# Patient Record
Sex: Female | Born: 1975 | Race: White | Hispanic: No | Marital: Married | Smoking: Never smoker
Health system: Southern US, Community
[De-identification: ages and names within clinical notes are randomized; demographics above are authoritative.]

## PROBLEM LIST (undated history)

## (undated) DIAGNOSIS — E119 Type 2 diabetes mellitus without complications: Secondary | ICD-10-CM

## (undated) DIAGNOSIS — R011 Cardiac murmur, unspecified: Secondary | ICD-10-CM

## (undated) DIAGNOSIS — E785 Hyperlipidemia, unspecified: Secondary | ICD-10-CM

## (undated) HISTORY — DX: Cardiac murmur, unspecified: R01.1

## (undated) HISTORY — DX: Hyperlipidemia, unspecified: E78.5

## (undated) HISTORY — DX: Type 2 diabetes mellitus without complications: E11.9

## (undated) HISTORY — PX: WISDOM TOOTH EXTRACTION: SHX21

---

## 2014-03-25 LAB — HM DIABETES EYE EXAM

## 2014-08-14 ENCOUNTER — Encounter: Payer: Self-pay | Admitting: Internal Medicine

## 2014-08-14 DIAGNOSIS — E611 Iron deficiency: Secondary | ICD-10-CM

## 2014-08-14 DIAGNOSIS — E119 Type 2 diabetes mellitus without complications: Secondary | ICD-10-CM

## 2014-08-14 DIAGNOSIS — E118 Type 2 diabetes mellitus with unspecified complications: Secondary | ICD-10-CM | POA: Insufficient documentation

## 2014-08-14 DIAGNOSIS — I10 Essential (primary) hypertension: Secondary | ICD-10-CM | POA: Insufficient documentation

## 2014-08-23 ENCOUNTER — Other Ambulatory Visit: Payer: Self-pay | Admitting: Obstetrics and Gynecology

## 2014-08-23 DIAGNOSIS — Z1231 Encounter for screening mammogram for malignant neoplasm of breast: Secondary | ICD-10-CM

## 2014-08-28 ENCOUNTER — Ambulatory Visit
Admission: RE | Admit: 2014-08-28 | Discharge: 2014-08-28 | Disposition: A | Discharge status: Home / Self Care | Payer: PRIVATE HEALTH INSURANCE | Source: Ambulatory Visit | Attending: Obstetrics and Gynecology | Admitting: Obstetrics and Gynecology

## 2014-08-28 ENCOUNTER — Other Ambulatory Visit: Payer: Self-pay | Admitting: Internal Medicine

## 2014-08-28 DIAGNOSIS — Z1231 Encounter for screening mammogram for malignant neoplasm of breast: Secondary | ICD-10-CM | POA: Insufficient documentation

## 2014-10-02 ENCOUNTER — Ambulatory Visit: Payer: Self-pay | Admitting: Internal Medicine

## 2014-10-10 ENCOUNTER — Encounter: Payer: Self-pay | Admitting: Internal Medicine

## 2014-10-11 ENCOUNTER — Ambulatory Visit (INDEPENDENT_AMBULATORY_CARE_PROVIDER_SITE_OTHER): Payer: PRIVATE HEALTH INSURANCE | Admitting: Family Medicine

## 2014-10-11 ENCOUNTER — Other Ambulatory Visit: Payer: Self-pay

## 2014-10-11 ENCOUNTER — Encounter: Payer: Self-pay | Admitting: Family Medicine

## 2014-10-11 VITALS — BP 120/80 | HR 64 | Ht 68.0 in | Wt 209.0 lb

## 2014-10-11 DIAGNOSIS — E1169 Type 2 diabetes mellitus with other specified complication: Secondary | ICD-10-CM | POA: Insufficient documentation

## 2014-10-11 DIAGNOSIS — IMO0002 Reserved for concepts with insufficient information to code with codable children: Secondary | ICD-10-CM

## 2014-10-11 DIAGNOSIS — I10 Essential (primary) hypertension: Secondary | ICD-10-CM | POA: Diagnosis not present

## 2014-10-11 DIAGNOSIS — E785 Hyperlipidemia, unspecified: Secondary | ICD-10-CM | POA: Diagnosis not present

## 2014-10-11 DIAGNOSIS — E1165 Type 2 diabetes mellitus with hyperglycemia: Secondary | ICD-10-CM | POA: Diagnosis not present

## 2014-10-11 MED ORDER — METFORMIN HCL ER 500 MG PO TB24: 500.0000 mg | ORAL_TABLET | Freq: Every day | ORAL | Status: DC

## 2014-10-11 MED ORDER — ATORVASTATIN CALCIUM 10 MG PO TABS: 10.0000 mg | ORAL_TABLET | Freq: Every day | ORAL | Status: DC

## 2014-10-11 MED ORDER — LISINOPRIL 20 MG PO TABS: 20.0000 mg | ORAL_TABLET | Freq: Every day | ORAL | Status: DC

## 2014-10-11 NOTE — Progress Notes (Signed)
Name: Alicia Richard   MRN: [id number]    DOB: 14-May-1975   Date:10/11/2014       Progress Note  Subjective  Chief Complaint  Chief Complaint  Patient presents with  . Diabetes    changed to ER- 2 tabs in am approx 4 months ago- no BS taken at home    Diabetes She presents for her follow-up diabetic visit. She has type 2 diabetes mellitus. Her disease course has been fluctuating. There are no hypoglycemic associated symptoms. Pertinent negatives for hypoglycemia include no dizziness, headaches or nervousness/anxiousness. Pertinent negatives for diabetes include no blurred vision, no chest pain, no fatigue, no foot paresthesias, no foot ulcerations, no polydipsia, no polyphagia, no polyuria, no visual change, no weakness and no weight loss. Symptoms are stable. Pertinent negatives for diabetic complications include no autonomic neuropathy, CVA, heart disease, nephropathy, peripheral neuropathy, PVD or retinopathy. Risk factors for coronary artery disease include diabetes mellitus, dyslipidemia and hypertension. Current diabetic treatment includes oral agent (monotherapy). She is compliant with treatment most of the time. Her weight is stable. She is following a generally healthy diet. She participates in exercise intermittently. An ACE inhibitor/angiotensin II receptor blocker is being taken. She does not see a podiatrist.Eye exam is current.  Hypertension This is a recurrent problem. The current episode started more than 1 year ago. The problem has been gradually improving since onset. Pertinent negatives include no anxiety, blurred vision, chest pain, headaches, malaise/fatigue, neck pain, palpitations, PND or shortness of breath. Past treatments include ACE inhibitors. The current treatment provides mild improvement. There are no compliance problems.  There is no history of angina, kidney disease, CAD/MI, CVA, heart failure, left ventricular hypertrophy, PVD, renovascular disease or retinopathy. There  is no history of chronic renal disease.  Hyperlipidemia This is a recurrent problem. The current episode started more than 1 year ago. The problem is controlled. Recent lipid tests were reviewed and are normal. She has no history of chronic renal disease, diabetes, hypothyroidism, liver disease, obesity or nephrotic syndrome. Pertinent negatives include no chest pain, focal sensory loss, focal weakness, leg pain, myalgias or shortness of breath. Current antihyperlipidemic treatment includes statins. The current treatment provides mild improvement of lipids. There are no compliance problems.     No problem-specific assessment & plan notes found for this encounter.   Past Medical History  Diagnosis Date  . Hyperlipidemia   . Diabetes mellitus without complication     History reviewed. No pertinent past surgical history.  Family History  Problem Relation Age of Onset  . Hypertension Mother   . Diabetes Maternal Grandmother   . Diabetes Paternal Grandmother     Social History   Social History  . Marital Status: Married    Spouse Name: N/A  . Number of Children: N/A  . Years of Education: N/A   Occupational History  . Not on file.   Social History Main Topics  . Smoking status: Never Smoker   . Smokeless tobacco: Not on file  . Alcohol Use: No  . Drug Use: No  . Sexual Activity: Yes   Other Topics Concern  . Not on file   Social History Narrative    No Known Allergies   Review of Systems  Constitutional: Negative for fever, chills, weight loss, malaise/fatigue and fatigue.  HENT: Negative for ear discharge, ear pain and sore throat.   Eyes: Negative for blurred vision.  Respiratory: Negative for cough, sputum production, shortness of breath and wheezing.   Cardiovascular: Negative for chest  pain, palpitations, leg swelling and PND.  Gastrointestinal: Negative for heartburn, nausea, abdominal pain, diarrhea, constipation, blood in stool and melena.  Genitourinary:  Negative for dysuria, urgency, frequency and hematuria.  Musculoskeletal: Negative for myalgias, back pain, joint pain and neck pain.  Skin: Negative for rash.  Neurological: Negative for dizziness, tingling, sensory change, focal weakness, weakness and headaches.  Endo/Heme/Allergies: Negative for environmental allergies, polydipsia and polyphagia. Does not bruise/bleed easily.  Psychiatric/Behavioral: Negative for depression and suicidal ideas. The patient is not nervous/anxious and does not have insomnia.      Objective  Filed Vitals:   10/11/14 0807  BP: 120/80  Pulse: 64  Height: 5\' 8"  (1.727 m)  Weight: 209 lb (94.802 kg)    Physical Exam  Constitutional: She is well-developed, well-nourished, and in no distress. No distress.  HENT:  Head: Normocephalic and atraumatic.  Right Ear: External ear normal.  Left Ear: External ear normal.  Nose: Nose normal.  Mouth/Throat: Oropharynx is clear and moist.  Eyes: Conjunctivae and EOM are normal. Pupils are equal, round, and reactive to light. Right eye exhibits no discharge. Left eye exhibits no discharge.  Neck: Normal range of motion. Neck supple. No JVD present. No thyromegaly present.  Cardiovascular: Normal rate, regular rhythm, normal heart sounds and intact distal pulses.  Exam reveals no gallop and no friction rub.   No murmur heard. Pulmonary/Chest: Effort normal and breath sounds normal.  Abdominal: Soft. Bowel sounds are normal. She exhibits no mass. There is no tenderness. There is no guarding.  Musculoskeletal: Normal range of motion. She exhibits no edema.  Lymphadenopathy:    She has no cervical adenopathy.  Neurological: She is alert. She has normal reflexes.  Skin: Skin is warm and dry. She is not diaphoretic.  Psychiatric: Mood and affect normal.      Assessment & Plan  Problem List Items Addressed This Visit      Cardiovascular and Mediastinum   Essential (primary) hypertension   Relevant Medications    atorvastatin (LIPITOR) 10 MG tablet   lisinopril (PRINIVIL,ZESTRIL) 20 MG tablet   Other Relevant Orders   Renal Function Panel     Other   Diabetes mellitus type 2, uncontrolled - Primary   Relevant Medications   atorvastatin (LIPITOR) 10 MG tablet   lisinopril (PRINIVIL,ZESTRIL) 20 MG tablet   metFORMIN (GLUCOPHAGE-XR) 500 MG 24 hr tablet   Other Relevant Orders   HgB A1c   Hyperlipidemia   Relevant Medications   atorvastatin (LIPITOR) 10 MG tablet   lisinopril (PRINIVIL,ZESTRIL) 20 MG tablet   Other Relevant Orders   Lipid Profile        Dr. Otilio Miu [facility]  10/11/2014

## 2014-10-11 NOTE — Addendum Note (Signed)
Addended by: Juline Patch on: 10/11/2014 08:43 AM   Modules accepted: Orders

## 2014-10-12 LAB — LIPID PANEL
CHOL/HDL RATIO: 3.6 ratio (ref 0.0–4.4)
Cholesterol, Total: 127 mg/dL (ref 100–199)
HDL: 35 mg/dL — AB (ref >39)
LDL Calculated: 53 mg/dL (ref 0–99)
TRIGLYCERIDES: 193 mg/dL — AB (ref 0–149)
VLDL CHOLESTEROL CAL: 39 mg/dL (ref 5–40)

## 2014-10-12 LAB — HEMOGLOBIN A1C
Est. average glucose Bld gHb Est-mCnc: 171 mg/dL
HEMOGLOBIN A1C: 7.6 % — AB (ref 4.8–5.6)

## 2014-10-12 LAB — RENAL FUNCTION PANEL: Phosphorus: 3.6 mg/dL (ref 2.5–4.5)

## 2014-10-18 ENCOUNTER — Other Ambulatory Visit: Payer: Self-pay

## 2014-10-18 DIAGNOSIS — E1165 Type 2 diabetes mellitus with hyperglycemia: Secondary | ICD-10-CM

## 2014-10-18 DIAGNOSIS — IMO0002 Reserved for concepts with insufficient information to code with codable children: Secondary | ICD-10-CM

## 2014-10-18 MED ORDER — SITAGLIPTIN PHOSPHATE 100 MG PO TABS: 100.0000 mg | ORAL_TABLET | Freq: Every day | ORAL | Status: DC

## 2014-11-27 ENCOUNTER — Ambulatory Visit (INDEPENDENT_AMBULATORY_CARE_PROVIDER_SITE_OTHER): Payer: PRIVATE HEALTH INSURANCE | Admitting: Internal Medicine

## 2014-11-27 ENCOUNTER — Other Ambulatory Visit: Payer: Self-pay | Admitting: Internal Medicine

## 2014-11-27 ENCOUNTER — Encounter: Payer: Self-pay | Admitting: Internal Medicine

## 2014-11-27 VITALS — BP 130/68 | HR 80 | Ht 68.0 in | Wt 203.0 lb

## 2014-11-27 DIAGNOSIS — E1165 Type 2 diabetes mellitus with hyperglycemia: Secondary | ICD-10-CM | POA: Diagnosis not present

## 2014-11-27 DIAGNOSIS — Z23 Encounter for immunization: Secondary | ICD-10-CM

## 2014-11-27 DIAGNOSIS — IMO0001 Reserved for inherently not codable concepts without codable children: Secondary | ICD-10-CM

## 2014-11-27 NOTE — Progress Notes (Signed)
Date:  11/27/2014   Name:  Alicia Richard   DOB:  [birth year]   MRN:  [id number]   Chief Complaint: Diabetes  Patient is being seen to follow-up on diabetes. She was seen a couple months ago by Dr. Ronnald Ramp who checked her A1c and started her on Januvia. Her A1c was 7.6. Januvia was dosed at 100 mg daily. She's been monitoring her blood sugar several times per week and is running between 90 and 144. She feels well without any side effects to medication. Her only complaint is cost of $40 per month.    Review of Systems  Constitutional: Positive for unexpected weight change (She has lost about 13 pounds over the past few months with diet and exercise). Negative for fever, chills and fatigue.  Eyes: Negative for visual disturbance.  Respiratory: Negative for chest tightness and shortness of breath.   Cardiovascular: Negative for chest pain, palpitations and leg swelling.  Gastrointestinal: Negative for nausea and diarrhea.  Endocrine: Negative for polydipsia and polyuria.  Genitourinary: Negative for frequency.    Patient Active Problem List   Diagnosis Date Noted  . Hyperlipidemia 10/11/2014  . Essential (primary) hypertension 08/14/2014  . Iron deficiency 08/14/2014  . Diabetes mellitus type 2, uncontrolled ([place]) 08/14/2014    Prior to Admission medications   Medication Sig Start Date End Date Taking? Authorizing Provider  atorvastatin (LIPITOR) 10 MG tablet Take 1 tablet (10 mg total) by mouth at bedtime. 10/11/14  Yes Juline Patch, MD  FREESTYLE LITE test strip U UTD ONCE D 10/21/14  Yes Historical Provider, MD  lisinopril (PRINIVIL,ZESTRIL) 20 MG tablet Take 1 tablet (20 mg total) by mouth daily. 10/11/14  Yes Juline Patch, MD  metFORMIN (GLUCOPHAGE-XR) 500 MG 24 hr tablet Take 1 tablet (500 mg total) by mouth daily with breakfast. Take 2 tablets (1000 mg total) by mouth with breakfast 10/11/14  Yes Juline Patch, MD  sitaGLIPtin (JANUVIA) 100 MG tablet Take 1 tablet (100 mg total) by  mouth daily. 10/18/14  Yes Juline Patch, MD    No Known Allergies  No past surgical history on file.  Social History  Substance Use Topics  . Smoking status: Never Smoker   . Smokeless tobacco: None  . Alcohol Use: No     Medication list has been reviewed and updated.  Physical Examination:  Physical Exam  Constitutional: She appears well-developed.  Neck: Normal range of motion. Carotid bruit is not present.  Cardiovascular: Normal rate, regular rhythm and normal heart sounds.   Pulmonary/Chest: Effort normal and breath sounds normal.  Musculoskeletal: She exhibits no edema.  Skin: Skin is warm, dry and intact.  Psychiatric: She has a normal mood and affect.    BP 130/68 mmHg  Pulse 80  Ht 5\' 8"  (1.727 m)  Wt 203 lb (92.08 kg)  BMI 30.87 kg/m2  LMP 11/03/2014 (Approximate)  Assessment and Plan: 1. Uncontrolled type 2 diabetes mellitus without complication, without long-term current use of insulin (HCC) Continue metformin and regular FSBS Coupon for 24 months of free Onglyza is given Recommend that she check prices for several medications in the same class call tomorrow for her preferred  2. Need for influenza vaccination - Flu Vaccine QUAD 36+ mos IM   [name], MD [facility]  11/27/2014

## 2014-11-28 ENCOUNTER — Other Ambulatory Visit: Payer: Self-pay | Admitting: Internal Medicine

## 2014-11-28 ENCOUNTER — Telehealth: Payer: Self-pay

## 2014-11-28 DIAGNOSIS — IMO0001 Reserved for inherently not codable concepts without codable children: Secondary | ICD-10-CM

## 2014-11-28 DIAGNOSIS — E1165 Type 2 diabetes mellitus with hyperglycemia: Principal | ICD-10-CM

## 2014-11-28 MED ORDER — SITAGLIPTIN PHOSPHATE 100 MG PO TABS: 100.0000 mg | ORAL_TABLET | Freq: Every day | ORAL | Status: DC

## 2014-11-28 NOTE — Telephone Encounter (Signed)
Patient would like to stay with Januvia and it will be better cost if you send for 90 days. dr

## 2015-03-08 ENCOUNTER — Other Ambulatory Visit: Payer: Self-pay | Admitting: Internal Medicine

## 2015-04-14 ENCOUNTER — Encounter: Payer: Self-pay | Admitting: Internal Medicine

## 2015-04-14 ENCOUNTER — Ambulatory Visit (INDEPENDENT_AMBULATORY_CARE_PROVIDER_SITE_OTHER): Payer: PRIVATE HEALTH INSURANCE | Admitting: Internal Medicine

## 2015-04-14 VITALS — BP 124/76 | HR 72 | Ht 68.0 in | Wt 204.0 lb

## 2015-04-14 DIAGNOSIS — I10 Essential (primary) hypertension: Secondary | ICD-10-CM | POA: Diagnosis not present

## 2015-04-14 DIAGNOSIS — E1165 Type 2 diabetes mellitus with hyperglycemia: Secondary | ICD-10-CM | POA: Diagnosis not present

## 2015-04-14 DIAGNOSIS — IMO0001 Reserved for inherently not codable concepts without codable children: Secondary | ICD-10-CM

## 2015-04-14 DIAGNOSIS — E785 Hyperlipidemia, unspecified: Secondary | ICD-10-CM | POA: Diagnosis not present

## 2015-04-14 MED ORDER — LISINOPRIL 20 MG PO TABS: 20.0000 mg | ORAL_TABLET | Freq: Every day | ORAL | Status: DC

## 2015-04-14 NOTE — Progress Notes (Signed)
Date:  04/14/2015   Name:  Alicia Richard   DOB:  Jan 27, 1976   MRN:  [id number]   Chief Complaint: Follow-up; Diabetes; Hyperlipidemia; and Hypertension Diabetes She presents for her follow-up diabetic visit. She has type 2 diabetes mellitus. Her disease course has been stable. Pertinent negatives for hypoglycemia include no headaches or tremors. Pertinent negatives for diabetes include no chest pain, no fatigue, no polydipsia and no polyuria. Current diabetic treatment includes oral agent (dual therapy). She is compliant with treatment most of the time. She monitors blood glucose at home 1-2 x per day. Her breakfast blood glucose is taken between 7-8 am. Her breakfast blood glucose range is generally 110-130 mg/dl. An ACE inhibitor/angiotensin II receptor blocker is being taken.  Hyperlipidemia The current episode started more than 1 year ago. Pertinent negatives include no chest pain or shortness of breath. Current antihyperlipidemic treatment includes statins.  Hypertension This is a chronic problem. The current episode started more than 1 year ago. The problem is unchanged. The problem is controlled. Pertinent negatives include no chest pain, headaches, palpitations or shortness of breath. Past treatments include ACE inhibitors.    Lab Results  Component Value Date   HGBA1C 7.6* 10/11/2014   Lab Results  Component Value Date   CHOL 127 10/11/2014   HDL 35* 10/11/2014   LDLCALC 53 10/11/2014   TRIG 193* 10/11/2014   CHOLHDL 3.6 10/11/2014     Review of Systems  Constitutional: Negative for fever, appetite change, fatigue and unexpected weight change (she has lost about 12 lbs with life style changes).  HENT: Negative for tinnitus and trouble swallowing.   Eyes: Negative for visual disturbance.  Respiratory: Negative for cough, chest tightness and shortness of breath.   Cardiovascular: Negative for chest pain, palpitations and leg swelling.  Gastrointestinal: Negative for  abdominal pain.  Endocrine: Negative for polydipsia and polyuria.  Genitourinary: Negative for dysuria and hematuria.  Musculoskeletal: Negative for arthralgias.  Neurological: Negative for tremors, numbness and headaches.  Psychiatric/Behavioral: Negative for dysphoric mood.    Patient Active Problem List   Diagnosis Date Noted  . Hyperlipidemia 10/11/2014  . Essential (primary) hypertension 08/14/2014  . Iron deficiency 08/14/2014  . Diabetes mellitus type 2, uncontrolled ([place]) 08/14/2014    Prior to Admission medications   Medication Sig Start Date End Date Taking? Authorizing Provider  atorvastatin (LIPITOR) 10 MG tablet Take 1 tablet (10 mg total) by mouth at bedtime. 10/11/14  Yes Alicia Patch, MD  FREESTYLE LITE test strip U UTD ONCE D 10/21/14  Yes Historical Provider, MD  lisinopril (PRINIVIL,ZESTRIL) 20 MG tablet Take 1 tablet (20 mg total) by mouth daily. 10/11/14  Yes Alicia Patch, MD  metFORMIN (GLUCOPHAGE-XR) 500 MG 24 hr tablet TAKE 2 TABLETS BY MOUTH DAILY 03/08/15  Yes Alicia Hess, MD  sitaGLIPtin (JANUVIA) 100 MG tablet Take 1 tablet (100 mg total) by mouth daily. 11/28/14  Yes Alicia Hess, MD    No Known Allergies  Past Surgical History  Procedure Laterality Date  . Wisdom tooth extraction      Social History  Substance Use Topics  . Smoking status: Never Smoker   . Smokeless tobacco: None  . Alcohol Use: No     Medication list has been reviewed and updated.   Physical Exam  Constitutional: She is oriented to person, place, and time. She appears well-developed and well-nourished.  Eyes: Pupils are equal, round, and reactive to light.  Neck: Normal range of motion. Neck supple.  No thyromegaly present.  Cardiovascular: Normal rate, regular rhythm and normal heart sounds.   Pulmonary/Chest: Effort normal and breath sounds normal. She has no wheezes.  Musculoskeletal: She exhibits no edema or tenderness.  Neurological: She is alert and  oriented to person, place, and time. She has normal reflexes.  Skin: Skin is warm and dry.  Nursing note and vitals reviewed.   BP 124/76 mmHg  Pulse 72  Ht 5\' 8"  (1.727 m)  Wt 204 lb (92.534 kg)  BMI 31.03 kg/m2  LMP 04/08/2015  Assessment and Plan: 1. Uncontrolled type 2 diabetes mellitus without complication, without long-term current use of insulin ([place]) Doing well - continue medication and lifestyle changes for ongoing weight loss - Hemoglobin [id number] - Basic metabolic panel  2. Essential (primary) hypertension controlled - lisinopril (PRINIVIL,ZESTRIL) 20 MG tablet; Take 1 tablet (20 mg total) by mouth daily.  Dispense: 30 tablet; Refill: 12  3. Hyperlipidemia On statin therapy   Alicia Maidens, MD [facility]  04/14/2015

## 2015-04-15 LAB — BASIC METABOLIC PANEL
BUN/Creatinine Ratio: 14 (ref 9–23)
BUN: 8 mg/dL (ref 6–24)
CALCIUM: 9.2 mg/dL (ref 8.7–10.2)
CHLORIDE: 100 mmol/L (ref 96–106)
CO2: 25 mmol/L (ref 18–29)
CREATININE: 0.58 mg/dL (ref 0.57–1.00)
GFR calc Af Amer: 133 mL/min/{1.73_m2} (ref >59)
GFR calc non Af Amer: 116 mL/min/{1.73_m2} (ref >59)
GLUCOSE: 127 mg/dL — AB (ref 65–99)
Potassium: 4.2 mmol/L (ref 3.5–5.2)
Sodium: 139 mmol/L (ref 134–144)

## 2015-04-15 LAB — HEMOGLOBIN A1C
ESTIMATED AVERAGE GLUCOSE: 151 mg/dL
Hgb A1c MFr Bld: 6.9 % — ABNORMAL HIGH (ref 4.8–5.6)

## 2015-04-16 ENCOUNTER — Telehealth: Payer: Self-pay

## 2015-04-16 NOTE — Telephone Encounter (Signed)
-----   Message from Glean Hess, MD sent at 04/15/2015  1:23 PM EST ----- DM is much better - down to 6.9!  Continue same medication and diet/weight loss.

## 2015-04-16 NOTE — Telephone Encounter (Signed)
Left message for patient to call back  

## 2015-04-17 NOTE — Telephone Encounter (Signed)
Left message for patient to call back  

## 2015-04-21 NOTE — Telephone Encounter (Signed)
Spoke with patient. Patient advised of all results and verbalized understanding. Will call back with any future questions or concerns. MAH  

## 2015-06-02 ENCOUNTER — Other Ambulatory Visit: Payer: Self-pay | Admitting: Internal Medicine

## 2015-08-11 ENCOUNTER — Ambulatory Visit (INDEPENDENT_AMBULATORY_CARE_PROVIDER_SITE_OTHER): Payer: PRIVATE HEALTH INSURANCE | Admitting: Internal Medicine

## 2015-08-11 ENCOUNTER — Encounter: Payer: Self-pay | Admitting: Internal Medicine

## 2015-08-11 VITALS — BP 128/80 | HR 64 | Ht 68.0 in | Wt 200.0 lb

## 2015-08-11 DIAGNOSIS — E785 Hyperlipidemia, unspecified: Secondary | ICD-10-CM

## 2015-08-11 DIAGNOSIS — I10 Essential (primary) hypertension: Secondary | ICD-10-CM

## 2015-08-11 DIAGNOSIS — E1165 Type 2 diabetes mellitus with hyperglycemia: Secondary | ICD-10-CM | POA: Diagnosis not present

## 2015-08-11 DIAGNOSIS — IMO0001 Reserved for inherently not codable concepts without codable children: Secondary | ICD-10-CM

## 2015-08-11 MED ORDER — ATORVASTATIN CALCIUM 10 MG PO TABS: 10.0000 mg | ORAL_TABLET | Freq: Every day | ORAL | Status: DC

## 2015-08-11 NOTE — Progress Notes (Signed)
Date:  08/11/2015   Name:  Alicia Richard   DOB:  03-08-1975   MRN:  [id number]   Chief Complaint: Diabetes Diabetes Pertinent negatives for hypoglycemia include no dizziness, headaches, nervousness/anxiousness or tremors. Pertinent negatives for diabetes include no chest pain, no fatigue, no polydipsia and no polyuria.  She has been checking BS about once every 2 weeks.  Meter needs a new battery so hasn't done it recently.  She continues on healthy diet, is walking daily and has lost 4 more pounds.  Lab Results  Component Value Date   HGBA1C 6.9* 04/14/2015     Review of Systems  Constitutional: Negative for fever, chills, appetite change, fatigue and unexpected weight change.  HENT: Negative for congestion, hearing loss, tinnitus, trouble swallowing and voice change.   Eyes: Negative for visual disturbance.  Respiratory: Negative for cough, chest tightness, shortness of breath and wheezing.   Cardiovascular: Negative for chest pain, palpitations and leg swelling.  Gastrointestinal: Negative for vomiting, abdominal pain, diarrhea and constipation.  Endocrine: Negative for polydipsia and polyuria.  Genitourinary: Negative for dysuria, frequency, hematuria, vaginal bleeding, vaginal discharge and genital sores.  Musculoskeletal: Negative for joint swelling, arthralgias and gait problem.  Skin: Negative for color change and rash.  Neurological: Negative for dizziness, tremors, light-headedness, numbness and headaches.  Hematological: Negative for adenopathy. Does not bruise/bleed easily.  Psychiatric/Behavioral: Negative for sleep disturbance and dysphoric mood. The patient is not nervous/anxious.     Patient Active Problem List   Diagnosis Date Noted  . Hyperlipidemia 10/11/2014  . Essential (primary) hypertension 08/14/2014  . Iron deficiency 08/14/2014  . Diabetes mellitus type 2, uncontrolled ([place]) 08/14/2014    Prior to Admission medications   Medication Sig Start Date  End Date Taking? Authorizing Provider  atorvastatin (LIPITOR) 10 MG tablet Take 1 tablet (10 mg total) by mouth at bedtime. 10/11/14  Yes Juline Patch, MD  FREESTYLE LITE test strip U UTD ONCE D 10/21/14  Yes Historical Provider, MD  lisinopril (PRINIVIL,ZESTRIL) 20 MG tablet Take 1 tablet (20 mg total) by mouth daily. 04/14/15  Yes Glean Hess, MD  metFORMIN (GLUCOPHAGE-XR) 500 MG 24 hr tablet TAKE 2 TABLETS BY MOUTH DAILY 06/03/15  Yes Glean Hess, MD  sitaGLIPtin (JANUVIA) 100 MG tablet Take 1 tablet (100 mg total) by mouth daily. 11/28/14  Yes Glean Hess, MD    No Known Allergies  Past Surgical History  Procedure Laterality Date  . Wisdom tooth extraction      Social History  Substance Use Topics  . Smoking status: Never Smoker   . Smokeless tobacco: None  . Alcohol Use: No     Medication list has been reviewed and updated.   Physical Exam  Constitutional: She is oriented to person, place, and time. She appears well-developed. No distress.  HENT:  Head: Normocephalic and atraumatic.  Neck: Normal range of motion. Neck supple. No thyromegaly present.  Cardiovascular: Normal rate, regular rhythm and normal heart sounds.   Pulmonary/Chest: Effort normal and breath sounds normal. No respiratory distress. She has no wheezes.  Musculoskeletal: Normal range of motion.  Lymphadenopathy:    She has no cervical adenopathy.  Neurological: She is alert and oriented to person, place, and time.  Skin: Skin is warm and dry. No rash noted.  Psychiatric: She has a normal mood and affect. Her behavior is normal. Thought content normal.    BP 128/80 mmHg  Pulse 64  Ht 5\' 8"  (1.727 m)  Wt 200 lb (  90.719 kg)  BMI 30.42 kg/m2  LMP 07/06/2015 (Approximate)  Assessment and Plan: 1. Uncontrolled type 2 diabetes mellitus without complication, without long-term current use of insulin (HCC) Continue current therapy, diet and exercise - Hemoglobin A1c  2. Essential (primary)  hypertension controlled  3. Hyperlipidemia On statin therapy - atorvastatin (LIPITOR) 10 MG tablet; Take 1 tablet (10 mg total) by mouth at bedtime.  Dispense: 30 tablet; Refill: [place], MD [facility]  08/11/2015

## 2015-08-12 ENCOUNTER — Ambulatory Visit: Payer: PRIVATE HEALTH INSURANCE | Admitting: Internal Medicine

## 2015-08-12 LAB — HEMOGLOBIN A1C
ESTIMATED AVERAGE GLUCOSE: 137 mg/dL
Hgb A1c MFr Bld: 6.4 % — ABNORMAL HIGH (ref 4.8–5.6)

## 2015-09-10 ENCOUNTER — Other Ambulatory Visit: Payer: Self-pay | Admitting: Obstetrics and Gynecology

## 2015-09-10 DIAGNOSIS — Z1231 Encounter for screening mammogram for malignant neoplasm of breast: Secondary | ICD-10-CM

## 2015-09-24 ENCOUNTER — Ambulatory Visit
Admission: RE | Admit: 2015-09-24 | Discharge: 2015-09-24 | Disposition: A | Discharge status: Home / Self Care | Payer: PRIVATE HEALTH INSURANCE | Source: Ambulatory Visit | Attending: Obstetrics and Gynecology | Admitting: Obstetrics and Gynecology

## 2015-09-24 DIAGNOSIS — Z1231 Encounter for screening mammogram for malignant neoplasm of breast: Secondary | ICD-10-CM | POA: Diagnosis present

## 2015-09-25 ENCOUNTER — Other Ambulatory Visit: Payer: Self-pay | Admitting: Obstetrics and Gynecology

## 2015-09-25 DIAGNOSIS — R921 Mammographic calcification found on diagnostic imaging of breast: Secondary | ICD-10-CM

## 2015-09-29 ENCOUNTER — Ambulatory Visit
Admission: RE | Admit: 2015-09-29 | Discharge: 2015-09-29 | Disposition: A | Discharge status: Home / Self Care | Payer: PRIVATE HEALTH INSURANCE | Source: Ambulatory Visit | Attending: Obstetrics and Gynecology | Admitting: Obstetrics and Gynecology

## 2015-09-29 DIAGNOSIS — R921 Mammographic calcification found on diagnostic imaging of breast: Secondary | ICD-10-CM

## 2015-09-30 ENCOUNTER — Other Ambulatory Visit: Payer: Self-pay | Admitting: Obstetrics and Gynecology

## 2015-09-30 DIAGNOSIS — R921 Mammographic calcification found on diagnostic imaging of breast: Secondary | ICD-10-CM

## 2015-10-10 ENCOUNTER — Ambulatory Visit
Admission: RE | Admit: 2015-10-10 | Discharge: 2015-10-10 | Disposition: A | Discharge status: Home / Self Care | Payer: PRIVATE HEALTH INSURANCE | Source: Ambulatory Visit | Attending: Obstetrics and Gynecology | Admitting: Obstetrics and Gynecology

## 2015-10-10 DIAGNOSIS — R921 Mammographic calcification found on diagnostic imaging of breast: Secondary | ICD-10-CM | POA: Insufficient documentation

## 2015-10-10 HISTORY — PX: BREAST BIOPSY: SHX20

## 2015-10-13 LAB — SURGICAL PATHOLOGY

## 2015-12-11 ENCOUNTER — Ambulatory Visit (INDEPENDENT_AMBULATORY_CARE_PROVIDER_SITE_OTHER): Payer: PRIVATE HEALTH INSURANCE | Admitting: Internal Medicine

## 2015-12-11 ENCOUNTER — Encounter: Payer: Self-pay | Admitting: Internal Medicine

## 2015-12-11 VITALS — BP 128/82 | HR 68 | Ht 69.0 in | Wt 198.0 lb

## 2015-12-11 DIAGNOSIS — I10 Essential (primary) hypertension: Secondary | ICD-10-CM | POA: Diagnosis not present

## 2015-12-11 DIAGNOSIS — E782 Mixed hyperlipidemia: Secondary | ICD-10-CM

## 2015-12-11 DIAGNOSIS — E119 Type 2 diabetes mellitus without complications: Secondary | ICD-10-CM

## 2015-12-11 MED ORDER — METFORMIN HCL ER 500 MG PO TB24: 1000.0000 mg | ORAL_TABLET | Freq: Every day | ORAL | 5 refills | Status: DC

## 2015-12-11 MED ORDER — SITAGLIPTIN PHOSPHATE 100 MG PO TABS: 100.0000 mg | ORAL_TABLET | Freq: Every day | ORAL | 5 refills | Status: DC

## 2015-12-11 MED ORDER — ATORVASTATIN CALCIUM 10 MG PO TABS: 10.0000 mg | ORAL_TABLET | Freq: Every day | ORAL | 5 refills | Status: DC

## 2015-12-11 NOTE — Progress Notes (Signed)
Date:  12/11/2015   Name:  Alicia Richard   DOB:  [birth year]   MRN:  [id number]   Chief Complaint: Follow-up (diabetes; didn't check this morning.) Diabetes  She presents for her follow-up diabetic visit. She has type 2 diabetes mellitus. Her disease course has been stable. Pertinent negatives for hypoglycemia include no headaches or tremors. Pertinent negatives for diabetes include no chest pain, no fatigue, no polydipsia and no polyuria. Symptoms are stable. Current diabetic treatment includes oral agent (dual therapy). She is compliant with treatment most of the time. Her weight is stable. She is following a generally healthy diet. She participates in exercise three times a week. Her breakfast blood glucose is taken between 7-8 am. Her breakfast blood glucose range is generally 130-140 mg/dl.  Hypertension  This is a chronic problem. The current episode started more than 1 year ago. The problem is unchanged. The problem is controlled. Pertinent negatives include no chest pain, headaches, palpitations or shortness of breath. Past treatments include ACE inhibitors. There are no compliance problems.   Hyperlipidemia  This is a chronic problem. The problem is controlled. Pertinent negatives include no chest pain, leg pain, myalgias or shortness of breath. Current antihyperlipidemic treatment includes statins. The current treatment provides significant improvement of lipids.    Lab Results  Component Value Date   HGBA1C 6.4 (H) 08/11/2015     Review of Systems  Constitutional: Negative for appetite change, fatigue, fever and unexpected weight change.  HENT: Negative for tinnitus and trouble swallowing.   Eyes: Negative for visual disturbance.  Respiratory: Negative for cough, chest tightness and shortness of breath.   Cardiovascular: Negative for chest pain, palpitations and leg swelling.  Gastrointestinal: Negative for abdominal pain.  Endocrine: Negative for polydipsia and polyuria.    Genitourinary: Negative for dysuria and hematuria.  Musculoskeletal: Negative for arthralgias and myalgias.  Neurological: Negative for tremors, numbness and headaches.  Psychiatric/Behavioral: Negative for dysphoric mood.    Patient Active Problem List   Diagnosis Date Noted  . Hyperlipidemia 10/11/2014  . Essential (primary) hypertension 08/14/2014  . Iron deficiency 08/14/2014  . Diabetes mellitus type 2, uncontrolled ([place]) 08/14/2014    Prior to Admission medications   Medication Sig Start Date End Date Taking? Authorizing Provider  atorvastatin (LIPITOR) 10 MG tablet Take 1 tablet (10 mg total) by mouth at bedtime. Patient taking differently: Take 10 mg by mouth every morning.  08/11/15  Yes Glean Hess, MD  FREESTYLE LITE test strip U UTD ONCE D 10/21/14  Yes Historical Provider, MD  lisinopril (PRINIVIL,ZESTRIL) 20 MG tablet Take 1 tablet (20 mg total) by mouth daily. 04/14/15  Yes Glean Hess, MD  metFORMIN (GLUCOPHAGE-XR) 500 MG 24 hr tablet TAKE 2 TABLETS BY MOUTH DAILY 06/03/15  Yes Glean Hess, MD  sitaGLIPtin (JANUVIA) 100 MG tablet Take 1 tablet (100 mg total) by mouth daily. 11/28/14  Yes Glean Hess, MD    No Known Allergies  Past Surgical History:  Procedure Laterality Date  . WISDOM TOOTH EXTRACTION      Social History  Substance Use Topics  . Smoking status: Never Smoker  . Smokeless tobacco: Not on file  . Alcohol use No     Medication list has been reviewed and updated.   Physical Exam  Constitutional: She is oriented to person, place, and time. She appears well-developed. No distress.  HENT:  Head: Normocephalic and atraumatic.  Neck: Normal range of motion. Neck supple. Carotid bruit is not present.  Cardiovascular: Normal rate, regular rhythm and normal heart sounds.   Pulmonary/Chest: Effort normal and breath sounds normal. No respiratory distress. She has no wheezes.  Neurological: She is alert and oriented to person, place,  and time. She has normal reflexes.  Skin: Skin is warm and dry. No rash noted.  Psychiatric: She has a normal mood and affect. Her behavior is normal. Thought content normal.  Nursing note and vitals reviewed.   BP 128/82   Pulse 68   Ht 5\' 9"  (1.753 m)   Wt 198 lb (89.8 kg)   BMI 29.24 kg/m   Assessment and Plan: 1. Controlled type 2 diabetes mellitus without complication, without long-term current use of insulin (HCC) Continue current therapy  - Hemoglobin A1c - Comprehensive metabolic panel - sitaGLIPtin (JANUVIA) 100 MG tablet; Take 1 tablet (100 mg total) by mouth daily.  Dispense: 30 tablet; Refill: 5 - metFORMIN (GLUCOPHAGE-XR) 500 MG 24 hr tablet; Take 2 tablets (1,000 mg total) by mouth daily.  Dispense: 60 tablet; Refill: 5  2. Essential (primary) hypertension controlled  3. Mixed hyperlipidemia On statin therapy - atorvastatin (LIPITOR) 10 MG tablet; Take 1 tablet (10 mg total) by mouth at bedtime.  Dispense: 30 tablet; Refill: [place], MD [facility]  12/11/2015

## 2015-12-12 LAB — COMPREHENSIVE METABOLIC PANEL
ALBUMIN: 4.6 g/dL (ref 3.5–5.5)
ALK PHOS: 90 IU/L (ref 39–117)
ALT: 14 IU/L (ref 0–32)
AST: 20 IU/L (ref 0–40)
Albumin/Globulin Ratio: 1.8 (ref 1.2–2.2)
BUN / CREAT RATIO: 16 (ref 9–23)
BUN: 10 mg/dL (ref 6–24)
Bilirubin Total: 0.5 mg/dL (ref 0.0–1.2)
CO2: 24 mmol/L (ref 18–29)
CREATININE: 0.63 mg/dL (ref 0.57–1.00)
Calcium: 9.6 mg/dL (ref 8.7–10.2)
Chloride: 96 mmol/L (ref 96–106)
GFR, EST AFRICAN AMERICAN: 129 mL/min/{1.73_m2} (ref >59)
GFR, EST NON AFRICAN AMERICAN: 112 mL/min/{1.73_m2} (ref >59)
GLOBULIN, TOTAL: 2.5 g/dL (ref 1.5–4.5)
Glucose: 130 mg/dL — ABNORMAL HIGH (ref 65–99)
Potassium: 4.8 mmol/L (ref 3.5–5.2)
SODIUM: 137 mmol/L (ref 134–144)
Total Protein: 7.1 g/dL (ref 6.0–8.5)

## 2015-12-12 LAB — HEMOGLOBIN A1C
Est. average glucose Bld gHb Est-mCnc: 143 mg/dL
Hgb A1c MFr Bld: 6.6 % — ABNORMAL HIGH (ref 4.8–5.6)

## 2015-12-28 ENCOUNTER — Other Ambulatory Visit: Payer: Self-pay | Admitting: Internal Medicine

## 2015-12-28 DIAGNOSIS — E1165 Type 2 diabetes mellitus with hyperglycemia: Principal | ICD-10-CM

## 2015-12-28 DIAGNOSIS — IMO0001 Reserved for inherently not codable concepts without codable children: Secondary | ICD-10-CM

## 2016-03-01 ENCOUNTER — Encounter: Payer: Self-pay | Admitting: Internal Medicine

## 2016-03-01 ENCOUNTER — Ambulatory Visit (INDEPENDENT_AMBULATORY_CARE_PROVIDER_SITE_OTHER): Payer: PRIVATE HEALTH INSURANCE | Admitting: Internal Medicine

## 2016-03-01 VITALS — BP 138/86 | HR 82 | Temp 97.9°F | Ht 71.0 in | Wt 200.0 lb

## 2016-03-01 DIAGNOSIS — H6983 Other specified disorders of Eustachian tube, bilateral: Secondary | ICD-10-CM | POA: Diagnosis not present

## 2016-03-01 NOTE — Patient Instructions (Signed)
Fluticasone nasal spray - 2 sprays in each nostril once a day  Ibuprofen 600-800 mg three times a day

## 2016-03-01 NOTE — Progress Notes (Signed)
Date:  03/01/2016   Name:  Alicia Richard   DOB:  [birth year]   MRN:  [id number]   Chief Complaint: Sinus Problem (Pt stated sinus pressure, ear pain for 2 weeks.) '   Sinus Problem  This is a new problem. The current episode started in the past 7 days. The problem has been gradually worsening since onset. There has been no fever. The pain is moderate. Associated symptoms include congestion, ear pain and a sore throat. Pertinent negatives include no coughing, hoarse voice, shortness of breath or sinus pressure. The treatment provided mild relief.     Review of Systems  HENT: Positive for congestion, ear pain, postnasal drip and sore throat. Negative for hoarse voice, sinus pressure and trouble swallowing.   Respiratory: Negative for cough, chest tightness, shortness of breath and wheezing.   Cardiovascular: Negative for chest pain, palpitations and leg swelling.    Patient Active Problem List   Diagnosis Date Noted  . Hyperlipidemia 10/11/2014  . Essential (primary) hypertension 08/14/2014  . Iron deficiency 08/14/2014  . Controlled type 2 diabetes mellitus without complication, without long-term current use of insulin ([place]) 08/14/2014    Prior to Admission medications   Medication Sig Start Date End Date Taking? Authorizing Provider  atorvastatin (LIPITOR) 10 MG tablet Take 1 tablet (10 mg total) by mouth at bedtime. 12/11/15  Yes Glean Hess, MD  FREESTYLE LITE test strip U UTD ONCE D 10/21/14  Yes Historical Provider, MD  lisinopril (PRINIVIL,ZESTRIL) 20 MG tablet Take 1 tablet (20 mg total) by mouth daily. 04/14/15  Yes Glean Hess, MD  JANUVIA 100 MG tablet TAKE 1 TABLET(100 MG) BY MOUTH DAILY Patient not taking: Reported on 03/01/2016 12/28/15   Glean Hess, MD  metFORMIN (GLUCOPHAGE-XR) 500 MG 24 hr tablet Take 2 tablets (1,000 mg total) by mouth daily. 12/11/15   Glean Hess, MD  sitaGLIPtin (JANUVIA) 100 MG tablet Take 1 tablet (100 mg total) by mouth daily.  12/11/15   Glean Hess, MD    No Known Allergies  Past Surgical History:  Procedure Laterality Date  . WISDOM TOOTH EXTRACTION      Social History  Substance Use Topics  . Smoking status: Never Smoker  . Smokeless tobacco: Not on file  . Alcohol use No     Medication list has been reviewed and updated.   Physical Exam  Constitutional: She is oriented to person, place, and time. She appears well-developed and well-nourished.  HENT:  Right Ear: External ear and ear canal normal. Tympanic membrane is bulging. Tympanic membrane is not erythematous.  Left Ear: External ear and ear canal normal. Tympanic membrane is retracted.  Nose: Right sinus exhibits no maxillary sinus tenderness and no frontal sinus tenderness. Left sinus exhibits no maxillary sinus tenderness and no frontal sinus tenderness.  Mouth/Throat: Uvula is midline and mucous membranes are normal. No oral lesions. No oropharyngeal exudate or posterior oropharyngeal erythema.  Cardiovascular: Normal rate, regular rhythm and normal heart sounds.   Pulmonary/Chest: Breath sounds normal. She has no wheezes. She has no rales.  Lymphadenopathy:    She has cervical adenopathy.       Right cervical: Superficial cervical adenopathy present.       Left cervical: Superficial cervical adenopathy present.  Neurological: She is alert and oriented to person, place, and time.    BP 138/86   Pulse 82   Temp 97.9 F (36.6 C)   Ht 5\' 11"  (1.803 m)   Wt 200  lb (90.7 kg)   SpO2 99%   BMI 27.89 kg/m   Assessment and Plan: 1. Eustachian tube dysfunction, bilateral Use ibuprofen and Flonase Call for antibiotics if s/s infection appear   [name], MD [facility]  03/01/2016

## 2016-04-12 ENCOUNTER — Encounter: Payer: Self-pay | Admitting: Internal Medicine

## 2016-04-12 ENCOUNTER — Ambulatory Visit (INDEPENDENT_AMBULATORY_CARE_PROVIDER_SITE_OTHER): Payer: PRIVATE HEALTH INSURANCE | Admitting: Internal Medicine

## 2016-04-12 DIAGNOSIS — I10 Essential (primary) hypertension: Secondary | ICD-10-CM

## 2016-04-12 DIAGNOSIS — E119 Type 2 diabetes mellitus without complications: Secondary | ICD-10-CM | POA: Diagnosis not present

## 2016-04-12 DIAGNOSIS — E782 Mixed hyperlipidemia: Secondary | ICD-10-CM

## 2016-04-12 MED ORDER — LISINOPRIL 20 MG PO TABS: 20.0000 mg | ORAL_TABLET | Freq: Every day | ORAL | 3 refills | Status: DC

## 2016-04-12 MED ORDER — SITAGLIPTIN PHOSPHATE 100 MG PO TABS: 100.0000 mg | ORAL_TABLET | Freq: Every day | ORAL | 3 refills | Status: DC

## 2016-04-12 MED ORDER — METFORMIN HCL ER 500 MG PO TB24: 1000.0000 mg | ORAL_TABLET | Freq: Every day | ORAL | 3 refills | Status: DC

## 2016-04-12 MED ORDER — ATORVASTATIN CALCIUM 10 MG PO TABS: 10.0000 mg | ORAL_TABLET | Freq: Every day | ORAL | 3 refills | Status: DC

## 2016-04-12 NOTE — Progress Notes (Signed)
Date:  04/12/2016   Name:  Alicia Richard   DOB:  March 15, 1975   MRN:  [id number]   Chief Complaint: Diabetes (BS was 140 this morning. Pt needs Januvia Sent to [facility] with 3 month supply. ) Diabetes  She presents for her follow-up diabetic visit. She has type 2 diabetes mellitus. Her disease course has been stable. Pertinent negatives for hypoglycemia include no headaches or tremors. Pertinent negatives for diabetes include no chest pain, no fatigue, no polydipsia and no polyuria. Symptoms are stable. Current diabetic treatment includes oral agent (dual therapy). Her weight is fluctuating minimally. She participates in exercise three times a week. Her breakfast blood glucose is taken between 6-7 am. Her breakfast blood glucose range is generally 130-140 mg/dl.  She has been out of Januvia for the past month due to cost.  Now she can get a better cost at [facility].  Lab Results  Component Value Date   HGBA1C 6.6 (H) 12/11/2015     Review of Systems  Constitutional: Negative for appetite change, fatigue, fever and unexpected weight change.  HENT: Negative for tinnitus and trouble swallowing.   Eyes: Negative for visual disturbance.  Respiratory: Negative for cough, chest tightness and shortness of breath.   Cardiovascular: Negative for chest pain, palpitations and leg swelling.  Gastrointestinal: Negative for abdominal pain.  Endocrine: Negative for polydipsia and polyuria.  Genitourinary: Negative for dysuria and hematuria.  Musculoskeletal: Negative for arthralgias.  Neurological: Negative for tremors, numbness and headaches.  Psychiatric/Behavioral: Negative for dysphoric mood.    Patient Active Problem List   Diagnosis Date Noted  . Hyperlipidemia 10/11/2014  . Essential (primary) hypertension 08/14/2014  . Iron deficiency 08/14/2014  . Controlled type 2 diabetes mellitus without complication, without long-term current use of insulin ([place]) 08/14/2014    Prior to Admission medications     Medication Sig Start Date End Date Taking? Authorizing Provider  atorvastatin (LIPITOR) 10 MG tablet Take 1 tablet (10 mg total) by mouth at bedtime. 12/11/15  Yes Glean Hess, MD  FREESTYLE LITE test strip U UTD ONCE D 10/21/14  Yes Historical Provider, MD  lisinopril (PRINIVIL,ZESTRIL) 20 MG tablet Take 1 tablet (20 mg total) by mouth daily. 04/14/15  Yes Glean Hess, MD  metFORMIN (GLUCOPHAGE-XR) 500 MG 24 hr tablet Take 2 tablets (1,000 mg total) by mouth daily. 12/11/15  Yes Glean Hess, MD  sitaGLIPtin (JANUVIA) 100 MG tablet Take 1 tablet (100 mg total) by mouth daily. 12/11/15   Glean Hess, MD    No Known Allergies  Past Surgical History:  Procedure Laterality Date  . WISDOM TOOTH EXTRACTION      Social History  Substance Use Topics  . Smoking status: Never Smoker  . Smokeless tobacco: Never Used  . Alcohol use No     Medication list has been reviewed and updated.   Physical Exam  Constitutional: She is oriented to person, place, and time. She appears well-developed. No distress.  HENT:  Head: Normocephalic and atraumatic.  Neck: Normal range of motion. Neck supple. Carotid bruit is not present. No thyromegaly present.  Cardiovascular: Normal rate, regular rhythm and normal heart sounds.   Pulmonary/Chest: Effort normal and breath sounds normal. No respiratory distress.  Musculoskeletal: She exhibits no edema or tenderness.  Neurological: She is alert and oriented to person, place, and time.  Skin: Skin is warm, dry and intact. No rash noted.  Psychiatric: She has a normal mood and affect. Her behavior is normal. Thought content normal.  Nursing note and vitals reviewed.   BP 114/78   Pulse 66   Ht 5\' 8"  (1.727 m)   Wt 199 lb (90.3 kg)   LMP 03/05/2016 (Within Days)   SpO2 100%   BMI 30.26 kg/m   Assessment and Plan: 1. Mixed hyperlipidemia Continue statin - atorvastatin (LIPITOR) 10 MG tablet; Take 1 tablet (10 mg total) by mouth at  bedtime.  Dispense: 90 tablet; Refill: 3  2. Essential (primary) hypertension controlled - lisinopril (PRINIVIL,ZESTRIL) 20 MG tablet; Take 1 tablet (20 mg total) by mouth daily.  Dispense: 90 tablet; Refill: 3  3. Controlled type 2 diabetes mellitus without complication, without long-term current use of insulin (HCC) Consider stopping Januvia if A1C is still <7 - metFORMIN (GLUCOPHAGE-XR) 500 MG 24 hr tablet; Take 2 tablets (1,000 mg total) by mouth daily.  Dispense: 180 tablet; Refill: 3 - sitaGLIPtin (JANUVIA) 100 MG tablet; Take 1 tablet (100 mg total) by mouth daily.  Dispense: 90 tablet; Refill: 3 - Hemoglobin A1c   [name], MD [facility]  04/12/2016

## 2016-04-13 LAB — HEMOGLOBIN A1C
ESTIMATED AVERAGE GLUCOSE: 154 mg/dL
Hgb A1c MFr Bld: 7 % — ABNORMAL HIGH (ref 4.8–5.6)

## 2016-08-10 ENCOUNTER — Ambulatory Visit: Payer: PRIVATE HEALTH INSURANCE | Admitting: Internal Medicine

## 2016-09-01 ENCOUNTER — Other Ambulatory Visit: Payer: Self-pay | Admitting: Internal Medicine

## 2016-09-01 ENCOUNTER — Encounter: Payer: Self-pay | Admitting: Internal Medicine

## 2016-09-01 ENCOUNTER — Ambulatory Visit (INDEPENDENT_AMBULATORY_CARE_PROVIDER_SITE_OTHER): Payer: PRIVATE HEALTH INSURANCE | Admitting: Internal Medicine

## 2016-09-01 VITALS — BP 112/66 | HR 75 | Ht 68.0 in | Wt 207.0 lb

## 2016-09-01 DIAGNOSIS — E782 Mixed hyperlipidemia: Secondary | ICD-10-CM | POA: Diagnosis not present

## 2016-09-01 DIAGNOSIS — E119 Type 2 diabetes mellitus without complications: Secondary | ICD-10-CM

## 2016-09-01 DIAGNOSIS — Z23 Encounter for immunization: Secondary | ICD-10-CM

## 2016-09-01 DIAGNOSIS — I10 Essential (primary) hypertension: Secondary | ICD-10-CM | POA: Diagnosis not present

## 2016-09-01 NOTE — Patient Instructions (Addendum)

## 2016-09-01 NOTE — Progress Notes (Signed)
Date:  09/01/2016   Name:  Alicia Richard   DOB:  1975-07-23   MRN:  [id number]   Chief Complaint: Diabetes (Has not checked sugar in " several weeks ") and Hypertension Diabetes  She presents for her follow-up diabetic visit. She has type 2 diabetes mellitus. Her disease course has been stable. Pertinent negatives for hypoglycemia include no headaches or tremors. Pertinent negatives for diabetes include no chest pain, no fatigue, no polydipsia and no polyuria. Current diabetic treatment includes oral agent (dual therapy) (metformin and januvia). There is no compliance with monitoring of blood glucose. An ACE inhibitor/angiotensin II receptor blocker is being taken.  Hypertension  This is a chronic problem. The problem is controlled. Pertinent negatives include no chest pain, headaches, palpitations or shortness of breath.    Lab Results  Component Value Date   HGBA1C 7.0 (H) 04/12/2016     Review of Systems  Constitutional: Negative for appetite change, fatigue, fever and unexpected weight change.  HENT: Negative for tinnitus and trouble swallowing.   Eyes: Negative for visual disturbance.  Respiratory: Negative for cough, chest tightness and shortness of breath.   Cardiovascular: Negative for chest pain, palpitations and leg swelling.  Gastrointestinal: Negative for abdominal pain.  Endocrine: Negative for polydipsia and polyuria.  Genitourinary: Negative for dysuria and hematuria.  Musculoskeletal: Negative for arthralgias.  Neurological: Negative for tremors, numbness and headaches.  Psychiatric/Behavioral: Negative for dysphoric mood.    Patient Active Problem List   Diagnosis Date Noted  . Hyperlipidemia 10/11/2014  . Essential (primary) hypertension 08/14/2014  . Iron deficiency 08/14/2014  . Controlled type 2 diabetes mellitus without complication, without long-term current use of insulin ([place]) 08/14/2014    Prior to Admission medications   Medication Sig Start Date  End Date Taking? Authorizing Provider  atorvastatin (LIPITOR) 10 MG tablet Take 1 tablet (10 mg total) by mouth at bedtime. 04/12/16  Yes Glean Hess, MD  FREESTYLE LITE test strip U UTD ONCE D 10/21/14  Yes [provider]  lisinopril (PRINIVIL,ZESTRIL) 20 MG tablet Take 1 tablet (20 mg total) by mouth daily. 04/12/16  Yes Glean Hess, MD  metFORMIN (GLUCOPHAGE-XR) 500 MG 24 hr tablet Take 2 tablets (1,000 mg total) by mouth daily. 04/12/16  Yes Glean Hess, MD  sitaGLIPtin (JANUVIA) 100 MG tablet Take 1 tablet (100 mg total) by mouth daily. 04/12/16  Yes Glean Hess, MD    No Known Allergies  Past Surgical History:  Procedure Laterality Date  . WISDOM TOOTH EXTRACTION      Social History  Substance Use Topics  . Smoking status: Never Smoker  . Smokeless tobacco: Never Used  . Alcohol use No     Medication list has been reviewed and updated.   Physical Exam  Constitutional: She is oriented to person, place, and time. She appears well-developed. No distress.  HENT:  Head: Normocephalic and atraumatic.  Neck: Normal range of motion. Neck supple. Carotid bruit is not present.  Cardiovascular: Normal rate, regular rhythm and normal heart sounds.   Pulmonary/Chest: Effort normal and breath sounds normal. No respiratory distress. She has no wheezes.  Musculoskeletal: Normal range of motion.  Neurological: She is alert and oriented to person, place, and time.  Skin: Skin is warm and dry. No rash noted.  Psychiatric: She has a normal mood and affect. Her speech is normal and behavior is normal. Thought content normal.  Nursing note and vitals reviewed.   BP 112/66   Pulse 75  Ht 5\' 8"  (1.727 m)   Wt 207 lb (93.9 kg)   LMP 08/01/2016 (Within Weeks)   SpO2 98%   BMI 31.47 kg/m   Assessment and Plan: 1. Controlled type 2 diabetes mellitus without complication, without long-term current use of insulin ([place]) Eye exam scheduled Discussed resuming  regular exercise - Basic metabolic panel - Hemoglobin A1c - Microalbumin / creatinine urine ratio  2. Essential (primary) hypertension controlled  3. Mixed hyperlipidemia On statin therapy - Lipid panel  4. Need for pneumococcal vaccination - Pneumococcal polysaccharide vaccine 23-valent greater than or equal to 2yo subcutaneous/IM   No orders of the defined types were placed in this encounter.   [name], MD [facility]  09/01/2016

## 2016-09-02 LAB — BASIC METABOLIC PANEL
BUN / CREAT RATIO: 14 (ref 9–23)
BUN: 9 mg/dL (ref 6–24)
CALCIUM: 9.6 mg/dL (ref 8.7–10.2)
CHLORIDE: 98 mmol/L (ref 96–106)
CO2: 22 mmol/L (ref 20–29)
Creatinine, Ser: 0.63 mg/dL (ref 0.57–1.00)
GFR calc Af Amer: 128 mL/min/{1.73_m2} (ref >59)
GFR calc non Af Amer: 111 mL/min/{1.73_m2} (ref >59)
GLUCOSE: 127 mg/dL — AB (ref 65–99)
Potassium: 4.6 mmol/L (ref 3.5–5.2)
Sodium: 139 mmol/L (ref 134–144)

## 2016-09-02 LAB — HEMOGLOBIN A1C
ESTIMATED AVERAGE GLUCOSE: 169 mg/dL
Hgb A1c MFr Bld: 7.5 % — ABNORMAL HIGH (ref 4.8–5.6)

## 2016-09-02 LAB — LIPID PANEL
CHOLESTEROL TOTAL: 123 mg/dL (ref 100–199)
Chol/HDL Ratio: 3.5 ratio (ref 0.0–4.4)
HDL: 35 mg/dL — AB (ref >39)
LDL Calculated: 54 mg/dL (ref 0–99)
Triglycerides: 170 mg/dL — ABNORMAL HIGH (ref 0–149)
VLDL CHOLESTEROL CAL: 34 mg/dL (ref 5–40)

## 2016-09-03 LAB — MICROALBUMIN / CREATININE URINE RATIO
CREATININE, UR: 80.2 mg/dL
MICROALB/CREAT RATIO: 4.5 mg/g{creat} (ref 0.0–30.0)
Microalbumin, Urine: 3.6 ug/mL

## 2016-09-16 ENCOUNTER — Other Ambulatory Visit: Payer: Self-pay | Admitting: Obstetrics and Gynecology

## 2016-09-16 DIAGNOSIS — Z1239 Encounter for other screening for malignant neoplasm of breast: Secondary | ICD-10-CM

## 2016-09-21 LAB — HM DIABETES EYE EXAM

## 2016-10-04 ENCOUNTER — Ambulatory Visit
Admission: RE | Admit: 2016-10-04 | Discharge: 2016-10-04 | Disposition: A | Discharge status: Home / Self Care | Payer: PRIVATE HEALTH INSURANCE | Source: Ambulatory Visit | Attending: Obstetrics and Gynecology | Admitting: Obstetrics and Gynecology

## 2016-10-04 DIAGNOSIS — Z1231 Encounter for screening mammogram for malignant neoplasm of breast: Secondary | ICD-10-CM | POA: Diagnosis present

## 2016-10-04 DIAGNOSIS — Z1239 Encounter for other screening for malignant neoplasm of breast: Secondary | ICD-10-CM

## 2017-01-03 ENCOUNTER — Ambulatory Visit: Payer: PRIVATE HEALTH INSURANCE | Admitting: Internal Medicine

## 2017-01-11 ENCOUNTER — Encounter: Payer: Self-pay | Admitting: Internal Medicine

## 2017-01-11 ENCOUNTER — Ambulatory Visit: Payer: PRIVATE HEALTH INSURANCE | Admitting: Internal Medicine

## 2017-01-11 VITALS — BP 122/78 | HR 70 | Ht 67.0 in | Wt 202.0 lb

## 2017-01-11 DIAGNOSIS — E782 Mixed hyperlipidemia: Secondary | ICD-10-CM | POA: Diagnosis not present

## 2017-01-11 DIAGNOSIS — E119 Type 2 diabetes mellitus without complications: Secondary | ICD-10-CM | POA: Diagnosis not present

## 2017-01-11 DIAGNOSIS — I10 Essential (primary) hypertension: Secondary | ICD-10-CM

## 2017-01-11 MED ORDER — METFORMIN HCL ER 500 MG PO TB24: 1000.0000 mg | ORAL_TABLET | Freq: Every day | ORAL | 3 refills | Status: DC

## 2017-01-11 MED ORDER — ATORVASTATIN CALCIUM 10 MG PO TABS: 10.0000 mg | ORAL_TABLET | Freq: Every day | ORAL | 3 refills | Status: DC

## 2017-01-11 MED ORDER — SITAGLIPTIN PHOSPHATE 100 MG PO TABS: 100.0000 mg | ORAL_TABLET | Freq: Every day | ORAL | 3 refills | Status: DC

## 2017-01-11 MED ORDER — LISINOPRIL 20 MG PO TABS: 20.0000 mg | ORAL_TABLET | Freq: Every day | ORAL | 3 refills | Status: DC

## 2017-01-11 NOTE — Progress Notes (Signed)
Date:  01/11/2017   Name:  Alicia Richard   DOB:  November 22, 1975   MRN:  [id number]   Chief Complaint: Diabetes and Hypertension Hypertension  This is a chronic problem. The problem is controlled. Pertinent negatives include no chest pain, headaches, palpitations or shortness of breath. Past treatments include ACE inhibitors. The current treatment provides significant improvement.  Diabetes  She presents for her follow-up diabetic visit. She has type 2 diabetes mellitus. Her disease course has been improving. Pertinent negatives for hypoglycemia include no headaches or tremors. Pertinent negatives for diabetes include no chest pain, no fatigue, no polydipsia and no polyuria. Current diabetic treatment includes oral agent (dual therapy). She is compliant with treatment all of the time. She monitors urine at home 1-2 x per month. Blood glucose monitoring compliance is good. Her breakfast blood glucose is taken between 7-8 am. Her breakfast blood glucose range is generally 130-140 mg/dl.    Lab Results  Component Value Date   HGBA1C 7.5 (H) 09/01/2016     Review of Systems  Constitutional: Negative for appetite change, fatigue, fever and unexpected weight change.  HENT: Negative for tinnitus and trouble swallowing.   Eyes: Negative for visual disturbance.  Respiratory: Negative for cough, chest tightness and shortness of breath.   Cardiovascular: Negative for chest pain, palpitations and leg swelling.  Gastrointestinal: Negative for abdominal pain.  Endocrine: Negative for polydipsia and polyuria.  Genitourinary: Negative for dysuria and hematuria.  Musculoskeletal: Negative for arthralgias.  Neurological: Negative for tremors, numbness and headaches.  Psychiatric/Behavioral: Negative for dysphoric mood.    Patient Active Problem List   Diagnosis Date Noted  . Hyperlipidemia 10/11/2014  . Essential (primary) hypertension 08/14/2014  . Iron deficiency 08/14/2014  . Controlled type 2  diabetes mellitus without complication, without long-term current use of insulin ([place]) 08/14/2014    Prior to Admission medications   Medication Sig Start Date End Date Taking? Authorizing Provider  atorvastatin (LIPITOR) 10 MG tablet Take 1 tablet (10 mg total) by mouth at bedtime. 04/12/16  Yes Glean Hess, MD  FREESTYLE LITE test strip U UTD ONCE D 10/21/14  Yes [provider]  lisinopril (PRINIVIL,ZESTRIL) 20 MG tablet Take 1 tablet (20 mg total) by mouth daily. 04/12/16  Yes Glean Hess, MD  metFORMIN (GLUCOPHAGE-XR) 500 MG 24 hr tablet Take 2 tablets (1,000 mg total) by mouth daily. 04/12/16  Yes Glean Hess, MD  sitaGLIPtin (JANUVIA) 100 MG tablet Take 1 tablet (100 mg total) by mouth daily. 04/12/16  Yes Glean Hess, MD    No Known Allergies  Past Surgical History:  Procedure Laterality Date  . BREAST BIOPSY Right 10/09/2016   Stereotactic biopsy - benign  . WISDOM TOOTH EXTRACTION      Social History   Tobacco Use  . Smoking status: Never Smoker  . Smokeless tobacco: Never Used  Substance Use Topics  . Alcohol use: No    Alcohol/week: 0.0 oz  . Drug use: No     Medication list has been reviewed and updated.  PHQ 2/9 Scores 01/11/2017 08/11/2015  PHQ - 2 Score 0 0    Physical Exam  Constitutional: She is oriented to person, place, and time. She appears well-developed. No distress.  HENT:  Head: Normocephalic and atraumatic.  Neck: Carotid bruit is not present.  Cardiovascular: Normal rate, regular rhythm and normal heart sounds.  Pulmonary/Chest: Effort normal and breath sounds normal. No respiratory distress. She has no wheezes.  Musculoskeletal: Normal range of motion.  Neurological: She is alert and oriented to person, place, and time. No cranial nerve deficit or sensory deficit.  Skin: Skin is warm and dry. No rash noted.  Psychiatric: She has a normal mood and affect. Her speech is normal and behavior is normal. Thought content  normal.  Nursing note and vitals reviewed.   BP 122/78   Pulse 70   Ht 5\' 7"  (1.702 m)   Wt 202 lb (91.6 kg)   LMP 12/27/2016 (Exact Date)   SpO2 100%   BMI 31.64 kg/m   Assessment and Plan: 1. Essential (primary) hypertension controlled - lisinopril (PRINIVIL,ZESTRIL) 20 MG tablet; Take 1 tablet (20 mg total) daily by mouth.  Dispense: 90 tablet; Refill: 3  2. Controlled type 2 diabetes mellitus without complication, without long-term current use of insulin (HCC) Doing well with weight loss and diet - Hemoglobin A1c - Comprehensive metabolic panel - sitaGLIPtin (JANUVIA) 100 MG tablet; Take 1 tablet (100 mg total) daily by mouth.  Dispense: 90 tablet; Refill: 3 - metFORMIN (GLUCOPHAGE-XR) 500 MG 24 hr tablet; Take 2 tablets (1,000 mg total) daily by mouth.  Dispense: 180 tablet; Refill: 3  3. Mixed hyperlipidemia On statin therapy Lab Results  Component Value Date   CHOL 123 09/01/2016   HDL 35 (L) 09/01/2016   LDLCALC 54 09/01/2016   TRIG 170 (H) 09/01/2016   CHOLHDL 3.5 09/01/2016    - atorvastatin (LIPITOR) 10 MG tablet; Take 1 tablet (10 mg total) at bedtime by mouth.  Dispense: 90 tablet; Refill: 3   Meds ordered this encounter  Medications  . sitaGLIPtin (JANUVIA) 100 MG tablet    Sig: Take 1 tablet (100 mg total) daily by mouth.    Dispense:  90 tablet    Refill:  3  . metFORMIN (GLUCOPHAGE-XR) 500 MG 24 hr tablet    Sig: Take 2 tablets (1,000 mg total) daily by mouth.    Dispense:  180 tablet    Refill:  3  . lisinopril (PRINIVIL,ZESTRIL) 20 MG tablet    Sig: Take 1 tablet (20 mg total) daily by mouth.    Dispense:  90 tablet    Refill:  3  . atorvastatin (LIPITOR) 10 MG tablet    Sig: Take 1 tablet (10 mg total) at bedtime by mouth.    Dispense:  90 tablet    Refill:  3    Partially dictated using Editor, commissioning. Any errors are unintentional.  [name], MD [facility]  01/11/2017

## 2017-01-12 LAB — COMPREHENSIVE METABOLIC PANEL
A/G RATIO: 1.9 (ref 1.2–2.2)
ALBUMIN: 4.4 g/dL (ref 3.5–5.5)
ALK PHOS: 90 IU/L (ref 39–117)
ALT: 15 IU/L (ref 0–32)
AST: 13 IU/L (ref 0–40)
BILIRUBIN TOTAL: 0.5 mg/dL (ref 0.0–1.2)
BUN / CREAT RATIO: 16 (ref 9–23)
BUN: 9 mg/dL (ref 6–24)
CHLORIDE: 101 mmol/L (ref 96–106)
CO2: 23 mmol/L (ref 20–29)
Calcium: 9.4 mg/dL (ref 8.7–10.2)
Creatinine, Ser: 0.58 mg/dL (ref 0.57–1.00)
GFR calc non Af Amer: 114 mL/min/{1.73_m2} (ref >59)
GFR, EST AFRICAN AMERICAN: 131 mL/min/{1.73_m2} (ref >59)
GLUCOSE: 110 mg/dL — AB (ref 65–99)
Globulin, Total: 2.3 g/dL (ref 1.5–4.5)
POTASSIUM: 4.5 mmol/L (ref 3.5–5.2)
Sodium: 140 mmol/L (ref 134–144)
Total Protein: 6.7 g/dL (ref 6.0–8.5)

## 2017-01-12 LAB — HEMOGLOBIN A1C
ESTIMATED AVERAGE GLUCOSE: 157 mg/dL
Hgb A1c MFr Bld: 7.1 % — ABNORMAL HIGH (ref 4.8–5.6)

## 2017-05-11 ENCOUNTER — Ambulatory Visit (INDEPENDENT_AMBULATORY_CARE_PROVIDER_SITE_OTHER): Payer: PRIVATE HEALTH INSURANCE | Admitting: Internal Medicine

## 2017-05-11 ENCOUNTER — Encounter: Payer: Self-pay | Admitting: Internal Medicine

## 2017-05-11 VITALS — BP 118/78 | HR 78 | Ht 68.0 in | Wt 206.0 lb

## 2017-05-11 DIAGNOSIS — E1165 Type 2 diabetes mellitus with hyperglycemia: Secondary | ICD-10-CM

## 2017-05-11 DIAGNOSIS — I10 Essential (primary) hypertension: Secondary | ICD-10-CM

## 2017-05-11 DIAGNOSIS — IMO0001 Reserved for inherently not codable concepts without codable children: Secondary | ICD-10-CM

## 2017-05-11 NOTE — Progress Notes (Signed)
Date:  05/11/2017   Name:  Alicia Richard   DOB:  February 22, 1976   MRN:  [id number]   Chief Complaint: Diabetes and Hypertension Diabetes  She presents for her follow-up diabetic visit. She has type 2 diabetes mellitus. Pertinent negatives for hypoglycemia include no headaches or tremors. Pertinent negatives for diabetes include no chest pain, no fatigue, no polydipsia and no polyuria. An ACE inhibitor/angiotensin II receptor blocker is being taken.  Hypertension  This is a chronic problem. The problem is controlled. Pertinent negatives include no chest pain, headaches, palpitations or shortness of breath. Past treatments include ACE inhibitors.  She is doing well.  Has not been checking her BS but takes medications regularly and is following a healthy diet.   Lab Results  Component Value Date   HGBA1C 7.1 (H) 01/11/2017     Review of Systems  Constitutional: Negative for appetite change, fatigue, fever and unexpected weight change.  HENT: Negative for tinnitus and trouble swallowing.   Eyes: Negative for visual disturbance.  Respiratory: Negative for cough, chest tightness and shortness of breath.   Cardiovascular: Negative for chest pain, palpitations and leg swelling.  Gastrointestinal: Negative for abdominal pain.  Endocrine: Negative for polydipsia and polyuria.  Genitourinary: Negative for dysuria and hematuria.  Musculoskeletal: Negative for arthralgias.  Neurological: Negative for tremors, numbness and headaches.  Psychiatric/Behavioral: Negative for dysphoric mood.    Patient Active Problem List   Diagnosis Date Noted  . Hyperlipidemia associated with type 2 diabetes mellitus ([place]) 10/11/2014  . Essential (primary) hypertension 08/14/2014  . Iron deficiency 08/14/2014  . Controlled type 2 diabetes mellitus without complication, without long-term current use of insulin ([place]) 08/14/2014    Prior to Admission medications   Medication Sig Start Date End Date Taking?  Authorizing Provider  atorvastatin (LIPITOR) 10 MG tablet Take 1 tablet (10 mg total) at bedtime by mouth. 01/11/17  Yes Glean Hess, MD  FREESTYLE LITE test strip U UTD ONCE D 10/21/14  Yes [provider]  lisinopril (PRINIVIL,ZESTRIL) 20 MG tablet Take 1 tablet (20 mg total) daily by mouth. 01/11/17  Yes Glean Hess, MD  metFORMIN (GLUCOPHAGE-XR) 500 MG 24 hr tablet Take 2 tablets (1,000 mg total) daily by mouth. 01/11/17  Yes Glean Hess, MD  sitaGLIPtin (JANUVIA) 100 MG tablet Take 1 tablet (100 mg total) daily by mouth. 01/11/17  Yes Glean Hess, MD    No Known Allergies  Past Surgical History:  Procedure Laterality Date  . BREAST BIOPSY Right 10/09/2016   Stereotactic biopsy - benign  . WISDOM TOOTH EXTRACTION      Social History   Tobacco Use  . Smoking status: Never Smoker  . Smokeless tobacco: Never Used  Substance Use Topics  . Alcohol use: No    Alcohol/week: 0.0 oz  . Drug use: No     Medication list has been reviewed and updated.  PHQ 2/9 Scores 01/11/2017 08/11/2015  PHQ - 2 Score 0 0    Physical Exam  Constitutional: She is oriented to person, place, and time. She appears well-developed. No distress.  HENT:  Head: Normocephalic and atraumatic.  Neck: Normal range of motion. Neck supple. Carotid bruit is not present.  Cardiovascular: Normal rate, regular rhythm and normal heart sounds.  Pulmonary/Chest: Effort normal. No respiratory distress.  Musculoskeletal: Normal range of motion. She exhibits no edema.  Neurological: She is alert and oriented to person, place, and time.  Skin: Skin is warm, dry and intact. No rash noted.  Psychiatric: She has a normal mood and affect. Her behavior is normal. Thought content normal.  Nursing note and vitals reviewed.   BP 118/78   Pulse 78   Ht 5\' 8"  (1.727 m)   Wt 206 lb (93.4 kg)   SpO2 99%   BMI 31.32 kg/m   Assessment and Plan: 1. Uncontrolled type 2 diabetes mellitus  without complication, without long-term current use of insulin (HCC) Continue current regimen Test BS at least once a week - Hemoglobin A1c  2. Essential (primary) hypertension controlled   No orders of the defined types were placed in this encounter.   Partially dictated using Editor, commissioning. Any errors are unintentional.  [name], MD [facility]  05/11/2017

## 2017-05-12 LAB — HEMOGLOBIN A1C
Est. average glucose Bld gHb Est-mCnc: 174 mg/dL
Hgb A1c MFr Bld: 7.7 % — ABNORMAL HIGH (ref 4.8–5.6)

## 2017-09-09 ENCOUNTER — Ambulatory Visit: Payer: PRIVATE HEALTH INSURANCE | Admitting: Internal Medicine

## 2017-09-19 ENCOUNTER — Other Ambulatory Visit: Payer: Self-pay | Admitting: Obstetrics and Gynecology

## 2017-09-19 DIAGNOSIS — Z1231 Encounter for screening mammogram for malignant neoplasm of breast: Secondary | ICD-10-CM

## 2017-09-19 LAB — HM PAP SMEAR: HM PAP: NORMAL

## 2017-09-19 LAB — RESULTS CONSOLE HPV: CHL HPV: NEGATIVE

## 2017-10-07 ENCOUNTER — Ambulatory Visit
Admission: RE | Admit: 2017-10-07 | Discharge: 2017-10-07 | Disposition: A | Discharge status: Home / Self Care | Payer: PRIVATE HEALTH INSURANCE | Source: Ambulatory Visit | Attending: Obstetrics and Gynecology | Admitting: Obstetrics and Gynecology

## 2017-10-07 DIAGNOSIS — Z1231 Encounter for screening mammogram for malignant neoplasm of breast: Secondary | ICD-10-CM

## 2017-10-10 ENCOUNTER — Other Ambulatory Visit: Payer: Self-pay | Admitting: Obstetrics and Gynecology

## 2017-10-10 DIAGNOSIS — R928 Other abnormal and inconclusive findings on diagnostic imaging of breast: Secondary | ICD-10-CM

## 2017-10-13 ENCOUNTER — Encounter: Payer: Self-pay | Admitting: Internal Medicine

## 2017-10-13 ENCOUNTER — Ambulatory Visit: Payer: PRIVATE HEALTH INSURANCE | Admitting: Internal Medicine

## 2017-10-13 VITALS — BP 108/70 | HR 69 | Ht 68.0 in | Wt 206.0 lb

## 2017-10-13 DIAGNOSIS — E782 Mixed hyperlipidemia: Secondary | ICD-10-CM | POA: Diagnosis not present

## 2017-10-13 DIAGNOSIS — I10 Essential (primary) hypertension: Secondary | ICD-10-CM | POA: Diagnosis not present

## 2017-10-13 DIAGNOSIS — E1165 Type 2 diabetes mellitus with hyperglycemia: Secondary | ICD-10-CM | POA: Diagnosis not present

## 2017-10-13 DIAGNOSIS — IMO0001 Reserved for inherently not codable concepts without codable children: Secondary | ICD-10-CM

## 2017-10-13 NOTE — Progress Notes (Signed)
Date:  10/13/2017   Name:  Alicia Richard   DOB:  06/21/1975   MRN:  [id number]   Chief Complaint: Diabetes and Hypertension Diabetes  She presents for her follow-up diabetic visit. She has type 2 diabetes mellitus. Her disease course has been improving. Pertinent negatives for hypoglycemia include no dizziness, headaches or tremors. Pertinent negatives for diabetes include no chest pain, no fatigue, no foot paresthesias, no polydipsia, no polyuria and no weight loss. Symptoms are stable. Current diabetic treatment includes oral agent (dual therapy). She is compliant with treatment all of the time. Her weight is stable. She is following a generally healthy diet. She participates in exercise three times a week. An ACE inhibitor/angiotensin II receptor blocker is being taken. Eye exam is not current.  Hypertension  This is a chronic problem. The problem is controlled. Pertinent negatives include no chest pain, headaches, palpitations or shortness of breath. Past treatments include ACE inhibitors.   Lab Results  Component Value Date   HGBA1C 7.7 (H) 05/11/2017     Review of Systems  Constitutional: Negative for appetite change, fatigue, fever, unexpected weight change and weight loss.  HENT: Negative for tinnitus and trouble swallowing.   Eyes: Negative for visual disturbance.  Respiratory: Negative for cough, chest tightness and shortness of breath.   Cardiovascular: Negative for chest pain, palpitations and leg swelling.  Gastrointestinal: Negative for abdominal pain.  Endocrine: Negative for polydipsia and polyuria.  Genitourinary: Negative for dysuria and hematuria.  Musculoskeletal: Negative for arthralgias.  Neurological: Negative for dizziness, tremors, numbness and headaches.  Psychiatric/Behavioral: Negative for dysphoric mood.    Patient Active Problem List   Diagnosis Date Noted  . Hyperlipidemia associated with type 2 diabetes mellitus ([place]) 10/11/2014  . Essential  (primary) hypertension 08/14/2014  . Iron deficiency 08/14/2014  . Controlled type 2 diabetes mellitus without complication, without long-term current use of insulin ([place]) 08/14/2014    No Known Allergies  Past Surgical History:  Procedure Laterality Date  . BREAST BIOPSY Right 10/10/2015   Stereotactic biopsy - benign  . WISDOM TOOTH EXTRACTION      Social History   Tobacco Use  . Smoking status: Never Smoker  . Smokeless tobacco: Never Used  Substance Use Topics  . Alcohol use: No    Alcohol/week: 0.0 standard drinks  . Drug use: No     Medication list has been reviewed and updated.  Current Meds  Medication Sig  . atorvastatin (LIPITOR) 10 MG tablet Take 1 tablet (10 mg total) at bedtime by mouth.  Marland Kitchen FREESTYLE LITE test strip U UTD ONCE D  . lisinopril (PRINIVIL,ZESTRIL) 20 MG tablet Take 1 tablet (20 mg total) daily by mouth.  . metFORMIN (GLUCOPHAGE-XR) 500 MG 24 hr tablet Take 2 tablets (1,000 mg total) daily by mouth.  . sitaGLIPtin (JANUVIA) 100 MG tablet Take 1 tablet (100 mg total) daily by mouth.    PHQ 2/9 Scores 10/13/2017 01/11/2017 08/11/2015  PHQ - 2 Score 0 0 0    Physical Exam  Constitutional: She is oriented to person, place, and time. She appears well-developed. No distress.  HENT:  Head: Normocephalic and atraumatic.  Neck: Normal range of motion. Neck supple.  Cardiovascular: Normal rate, regular rhythm and normal heart sounds.  Pulmonary/Chest: Effort normal and breath sounds normal. No respiratory distress.  Musculoskeletal: Normal range of motion. She exhibits no edema or tenderness.  Lymphadenopathy:    She has no cervical adenopathy.  Neurological: She is alert and oriented to person, place,  and time.  Skin: Skin is warm and dry. No rash noted.  Psychiatric: She has a normal mood and affect. Her behavior is normal. Thought content normal.  Nursing note and vitals reviewed.   BP 108/70 (BP Location: Right Arm, Patient Position: Sitting,  Cuff Size: Normal)   Pulse 69   Ht 5\' 8"  (1.727 m)   Wt 206 lb (93.4 kg)   LMP 09/15/2017   SpO2 100%   BMI 31.32 kg/m   Assessment and Plan: 1. Uncontrolled type 2 diabetes mellitus without complication, without long-term current use of insulin (HCC) Continue medication, diet and exercise - Hemoglobin A1c - Comprehensive metabolic panel  2. Essential (primary) hypertension controlled - TSH  3. Mixed hyperlipidemia On statin therapy - Lipid panel   No orders of the defined types were placed in this encounter.   Partially dictated using Editor, commissioning. Any errors are unintentional.  [name], MD [facility]  10/13/2017  There are no diagnoses linked to this encounter.

## 2017-10-14 LAB — COMPREHENSIVE METABOLIC PANEL
A/G RATIO: 2 (ref 1.2–2.2)
ALT: 14 IU/L (ref 0–32)
AST: 13 IU/L (ref 0–40)
Albumin: 4.5 g/dL (ref 3.5–5.5)
Alkaline Phosphatase: 88 IU/L (ref 39–117)
BILIRUBIN TOTAL: 0.4 mg/dL (ref 0.0–1.2)
BUN/Creatinine Ratio: 14 (ref 9–23)
BUN: 9 mg/dL (ref 6–24)
CHLORIDE: 100 mmol/L (ref 96–106)
CO2: 28 mmol/L (ref 20–29)
Calcium: 9.4 mg/dL (ref 8.7–10.2)
Creatinine, Ser: 0.64 mg/dL (ref 0.57–1.00)
GFR calc non Af Amer: 110 mL/min/{1.73_m2} (ref >59)
GFR, EST AFRICAN AMERICAN: 126 mL/min/{1.73_m2} (ref >59)
GLOBULIN, TOTAL: 2.2 g/dL (ref 1.5–4.5)
Glucose: 137 mg/dL — ABNORMAL HIGH (ref 65–99)
POTASSIUM: 4.4 mmol/L (ref 3.5–5.2)
Sodium: 139 mmol/L (ref 134–144)
TOTAL PROTEIN: 6.7 g/dL (ref 6.0–8.5)

## 2017-10-14 LAB — LIPID PANEL
CHOL/HDL RATIO: 3.4 ratio (ref 0.0–4.4)
Cholesterol, Total: 123 mg/dL (ref 100–199)
HDL: 36 mg/dL — AB (ref >39)
LDL CALC: 59 mg/dL (ref 0–99)
TRIGLYCERIDES: 139 mg/dL (ref 0–149)
VLDL Cholesterol Cal: 28 mg/dL (ref 5–40)

## 2017-10-14 LAB — HEMOGLOBIN A1C
Est. average glucose Bld gHb Est-mCnc: 180 mg/dL
Hgb A1c MFr Bld: 7.9 % — ABNORMAL HIGH (ref 4.8–5.6)

## 2017-10-14 LAB — TSH: TSH: 1.98 u[IU]/mL (ref 0.450–4.500)

## 2017-10-18 ENCOUNTER — Ambulatory Visit
Admission: RE | Admit: 2017-10-18 | Discharge: 2017-10-18 | Disposition: A | Discharge status: Home / Self Care | Payer: PRIVATE HEALTH INSURANCE | Source: Ambulatory Visit | Attending: Obstetrics and Gynecology | Admitting: Obstetrics and Gynecology

## 2017-10-18 DIAGNOSIS — R928 Other abnormal and inconclusive findings on diagnostic imaging of breast: Secondary | ICD-10-CM

## 2017-11-09 LAB — HM DIABETES EYE EXAM

## 2017-11-14 ENCOUNTER — Encounter: Payer: Self-pay | Admitting: Internal Medicine

## 2018-01-24 ENCOUNTER — Other Ambulatory Visit: Payer: Self-pay | Admitting: Internal Medicine

## 2018-01-24 DIAGNOSIS — I10 Essential (primary) hypertension: Secondary | ICD-10-CM

## 2018-01-24 DIAGNOSIS — E119 Type 2 diabetes mellitus without complications: Secondary | ICD-10-CM

## 2018-01-24 DIAGNOSIS — E782 Mixed hyperlipidemia: Secondary | ICD-10-CM

## 2018-02-13 ENCOUNTER — Ambulatory Visit (INDEPENDENT_AMBULATORY_CARE_PROVIDER_SITE_OTHER): Payer: PRIVATE HEALTH INSURANCE | Admitting: Internal Medicine

## 2018-02-13 ENCOUNTER — Encounter: Payer: Self-pay | Admitting: Internal Medicine

## 2018-02-13 VITALS — BP 112/78 | HR 68 | Ht 68.0 in | Wt 204.0 lb

## 2018-02-13 DIAGNOSIS — E785 Hyperlipidemia, unspecified: Secondary | ICD-10-CM | POA: Diagnosis not present

## 2018-02-13 DIAGNOSIS — E1169 Type 2 diabetes mellitus with other specified complication: Secondary | ICD-10-CM

## 2018-02-13 DIAGNOSIS — E119 Type 2 diabetes mellitus without complications: Secondary | ICD-10-CM | POA: Diagnosis not present

## 2018-02-13 DIAGNOSIS — I10 Essential (primary) hypertension: Secondary | ICD-10-CM | POA: Diagnosis not present

## 2018-02-13 NOTE — Progress Notes (Signed)
Date:  02/13/2018   Name:  Alicia Richard   DOB:  01-08-1976   MRN:  [id number]   Chief Complaint: Diabetes (Follow up. )  Diabetes  She presents for her follow-up diabetic visit. She has type 2 diabetes mellitus. Her disease course has been improving. Pertinent negatives for hypoglycemia include no headaches or tremors. Pertinent negatives for diabetes include no fatigue, no polydipsia and no polyuria. Current diabetic treatment includes oral agent (monotherapy). She is compliant with treatment all of the time. Her weight is decreasing steadily. She is following a generally healthy diet. There is no compliance (meter is broken) with monitoring of blood glucose. An ACE inhibitor/angiotensin II receptor blocker is being taken. Eye exam is current.  Hypertension  The problem is controlled. Pertinent negatives include no headaches or palpitations. Past treatments include ACE inhibitors. The current treatment provides significant improvement.   Lab Results  Component Value Date   HGBA1C 7.9 (H) 10/13/2017   Lab Results  Component Value Date   CREATININE 0.64 10/13/2017   BUN 9 10/13/2017   NA 139 10/13/2017   K 4.4 10/13/2017   CL 100 10/13/2017   CO2 28 10/13/2017   Lab Results  Component Value Date   CHOL 123 10/13/2017   HDL 36 (L) 10/13/2017   LDLCALC 59 10/13/2017   TRIG 139 10/13/2017   CHOLHDL 3.4 10/13/2017     Review of Systems  Constitutional: Negative for appetite change, fatigue, fever and unexpected weight change.  HENT: Negative for tinnitus and trouble swallowing.   Eyes: Negative for visual disturbance.  Respiratory: Negative for cough and chest tightness.   Cardiovascular: Negative for palpitations and leg swelling.  Gastrointestinal: Negative for abdominal pain.  Endocrine: Negative for polydipsia and polyuria.  Genitourinary: Negative for dysuria and hematuria.  Musculoskeletal: Negative for arthralgias.  Neurological: Negative for tremors, numbness and  headaches.  Psychiatric/Behavioral: Negative for dysphoric mood.    Patient Active Problem List   Diagnosis Date Noted  . Hyperlipidemia associated with type 2 diabetes mellitus ([place]) 10/11/2014  . Essential (primary) hypertension 08/14/2014  . Iron deficiency 08/14/2014  . Controlled type 2 diabetes mellitus without complication, without long-term current use of insulin ([place]) 08/14/2014    No Known Allergies  Past Surgical History:  Procedure Laterality Date  . BREAST BIOPSY Right 10/10/2015   Stereotactic biopsy - benign  . WISDOM TOOTH EXTRACTION      Social History   Tobacco Use  . Smoking status: Never Smoker  . Smokeless tobacco: Never Used  Substance Use Topics  . Alcohol use: No    Alcohol/week: 0.0 standard drinks  . Drug use: No     Medication list has been reviewed and updated.  Current Meds  Medication Sig  . atorvastatin (LIPITOR) 10 MG tablet TAKE 1 TABLET(10 MG) BY MOUTH AT BEDTIME  . FREESTYLE LITE test strip U UTD ONCE D  . lisinopril (PRINIVIL,ZESTRIL) 20 MG tablet TAKE 1 TABLET(20 MG) BY MOUTH DAILY  . metFORMIN (GLUCOPHAGE-XR) 500 MG 24 hr tablet TAKE 2 TABLETS(1000 MG) BY MOUTH DAILY  . sitaGLIPtin (JANUVIA) 100 MG tablet Take 1 tablet (100 mg total) daily by mouth.    PHQ 2/9 Scores 10/13/2017 01/11/2017 08/11/2015  PHQ - 2 Score 0 0 0    Physical Exam Vitals signs and nursing note reviewed.  Constitutional:      General: She is not in acute distress.    Appearance: She is well-developed.  HENT:     Head: Normocephalic and atraumatic.  Neck:     Musculoskeletal: Normal range of motion and neck supple.  Cardiovascular:     Rate and Rhythm: Normal rate and regular rhythm.     Pulses: Normal pulses.  Pulmonary:     Effort: Pulmonary effort is normal. No respiratory distress.  Musculoskeletal: Normal range of motion.     Right lower leg: No edema.     Left lower leg: No edema.  Skin:    General: Skin is warm and dry.     Findings: No  rash.  Neurological:     Mental Status: She is alert and oriented to person, place, and time.  Psychiatric:        Behavior: Behavior normal.        Thought Content: Thought content normal.    Wt Readings from Last 3 Encounters:  02/13/18 204 lb (92.5 kg)  10/13/17 206 lb (93.4 kg)  05/11/17 206 lb (93.4 kg)    BP 112/78 (BP Location: Right Arm, Patient Position: Sitting, Cuff Size: Large)   Pulse 68   Ht 5\' 8"  (1.727 m)   Wt 204 lb (92.5 kg)   LMP 02/07/2018 (Exact Date)   SpO2 99%   BMI 31.02 kg/m   Assessment and Plan: 1. Controlled type 2 diabetes mellitus without complication, without long-term current use of insulin ([place]) Continue current therapy Consider adding SGLT-2 - Hemoglobin A1c  2. Essential (primary) hypertension controlled  3. Hyperlipidemia associated with type 2 diabetes mellitus ([place]) On statin therapy   Partially dictated using Editor, commissioning. Any errors are unintentional.  [name], MD [facility]  02/13/2018

## 2018-02-14 LAB — HEMOGLOBIN A1C
Est. average glucose Bld gHb Est-mCnc: 174 mg/dL
Hgb A1c MFr Bld: 7.7 % — ABNORMAL HIGH (ref 4.8–5.6)

## 2018-03-24 ENCOUNTER — Other Ambulatory Visit: Payer: Self-pay | Admitting: Internal Medicine

## 2018-03-24 DIAGNOSIS — E119 Type 2 diabetes mellitus without complications: Secondary | ICD-10-CM

## 2018-06-10 ENCOUNTER — Other Ambulatory Visit: Payer: Self-pay | Admitting: Internal Medicine

## 2018-06-10 DIAGNOSIS — E119 Type 2 diabetes mellitus without complications: Secondary | ICD-10-CM

## 2018-06-10 DIAGNOSIS — I10 Essential (primary) hypertension: Secondary | ICD-10-CM

## 2018-06-10 DIAGNOSIS — E782 Mixed hyperlipidemia: Secondary | ICD-10-CM

## 2018-06-15 ENCOUNTER — Ambulatory Visit: Payer: PRIVATE HEALTH INSURANCE | Admitting: Internal Medicine

## 2018-07-13 ENCOUNTER — Encounter: Payer: Self-pay | Admitting: Internal Medicine

## 2018-07-13 ENCOUNTER — Other Ambulatory Visit: Payer: Self-pay

## 2018-07-13 ENCOUNTER — Ambulatory Visit (INDEPENDENT_AMBULATORY_CARE_PROVIDER_SITE_OTHER): Payer: PRIVATE HEALTH INSURANCE | Admitting: Internal Medicine

## 2018-07-13 VITALS — BP 122/78 | HR 79 | Resp 16 | Ht 68.0 in | Wt 204.0 lb

## 2018-07-13 DIAGNOSIS — E785 Hyperlipidemia, unspecified: Secondary | ICD-10-CM | POA: Diagnosis not present

## 2018-07-13 DIAGNOSIS — E119 Type 2 diabetes mellitus without complications: Secondary | ICD-10-CM | POA: Diagnosis not present

## 2018-07-13 DIAGNOSIS — I10 Essential (primary) hypertension: Secondary | ICD-10-CM | POA: Diagnosis not present

## 2018-07-13 DIAGNOSIS — E1169 Type 2 diabetes mellitus with other specified complication: Secondary | ICD-10-CM

## 2018-07-13 MED ORDER — ATORVASTATIN CALCIUM 10 MG PO TABS: 10.0000 mg | ORAL_TABLET | Freq: Every day | ORAL | 3 refills | Status: DC

## 2018-07-13 MED ORDER — METFORMIN HCL ER 500 MG PO TB24: 1000.0000 mg | ORAL_TABLET | Freq: Every day | ORAL | 3 refills | Status: DC

## 2018-07-13 MED ORDER — LISINOPRIL 20 MG PO TABS: 20.0000 mg | ORAL_TABLET | Freq: Every day | ORAL | 3 refills | Status: DC

## 2018-07-13 NOTE — Progress Notes (Signed)
Date:  07/13/2018   Name:  Alicia Richard   DOB:  [birth year]   MRN:  [id number]   Chief Complaint: Diabetes (4 MO)  Diabetes  She presents for her follow-up diabetic visit. She has type 2 diabetes mellitus. Pertinent negatives for hypoglycemia include no headaches or tremors. Pertinent negatives for diabetes include no chest pain, no fatigue, no polydipsia and no polyuria. Current diabetic treatment includes oral agent (dual therapy) (metformin and januvia). Her weight is stable. An ACE inhibitor/angiotensin II receptor blocker is being taken.  Hypertension  The problem is unchanged. The problem is controlled. Pertinent negatives include no chest pain, headaches, palpitations or shortness of breath. Past treatments include ACE inhibitors. The current treatment provides significant improvement. There are no compliance problems.    Lab Results  Component Value Date   HGBA1C 7.7 (H) 02/13/2018   Lab Results  Component Value Date   CREATININE 0.64 10/13/2017   BUN 9 10/13/2017   NA 139 10/13/2017   K 4.4 10/13/2017   CL 100 10/13/2017   CO2 28 10/13/2017   Lab Results  Component Value Date   CHOL 123 10/13/2017   HDL 36 (L) 10/13/2017   LDLCALC 59 10/13/2017   TRIG 139 10/13/2017   CHOLHDL 3.4 10/13/2017    Review of Systems  Constitutional: Negative for appetite change, fatigue, fever and unexpected weight change.  HENT: Negative for tinnitus and trouble swallowing.   Eyes: Negative for visual disturbance.  Respiratory: Negative for cough, chest tightness and shortness of breath.   Cardiovascular: Negative for chest pain, palpitations and leg swelling.  Gastrointestinal: Negative for abdominal pain.  Endocrine: Negative for polydipsia and polyuria.  Genitourinary: Negative for dysuria and hematuria.  Musculoskeletal: Negative for arthralgias.  Neurological: Negative for tremors, numbness and headaches.  Psychiatric/Behavioral: Negative for dysphoric mood.    Patient  Active Problem List   Diagnosis Date Noted  . Hyperlipidemia associated with type 2 diabetes mellitus ([place]) 10/11/2014  . Essential (primary) hypertension 08/14/2014  . Iron deficiency 08/14/2014  . Controlled type 2 diabetes mellitus without complication, without long-term current use of insulin ([place]) 08/14/2014    No Known Allergies  Past Surgical History:  Procedure Laterality Date  . BREAST BIOPSY Right 10/10/2015   Stereotactic biopsy - benign  . WISDOM TOOTH EXTRACTION      Social History   Tobacco Use  . Smoking status: Never Smoker  . Smokeless tobacco: Never Used  Substance Use Topics  . Alcohol use: No    Alcohol/week: 0.0 standard drinks  . Drug use: No     Medication list has been reviewed and updated.  Current Meds  Medication Sig  . atorvastatin (LIPITOR) 10 MG tablet TAKE 1 TABLET(10 MG) BY MOUTH AT BEDTIME  . FREESTYLE LITE test strip U UTD ONCE D  . JANUVIA 100 MG tablet TAKE 1 TABLET (100 MG TOTAL) DAILY BY MOUTH.  Marland Kitchen lisinopril (ZESTRIL) 20 MG tablet TAKE 1 TABLET(20 MG) BY MOUTH DAILY  . metFORMIN (GLUCOPHAGE-XR) 500 MG 24 hr tablet TAKE 2 TABLETS(1000 MG) BY MOUTH DAILY    PHQ 2/9 Scores 07/13/2018 10/13/2017 01/11/2017 08/11/2015  PHQ - 2 Score 0 0 0 0  PHQ- 9 Score 0 - - -    BP Readings from Last 3 Encounters:  07/13/18 122/78  02/13/18 112/78  10/13/17 108/70    Physical Exam Vitals signs and nursing note reviewed.  Constitutional:      General: She is not in acute distress.    Appearance:  She is well-developed.  HENT:     Head: Normocephalic and atraumatic.  Neck:     Vascular: No carotid bruit.  Cardiovascular:     Rate and Rhythm: Normal rate and regular rhythm.     Pulses:          Dorsalis pedis pulses are 2+ on the right side and 2+ on the left side.     Heart sounds: No murmur.  Pulmonary:     Effort: Pulmonary effort is normal. No respiratory distress.     Breath sounds: Normal breath sounds.  Musculoskeletal: Normal  range of motion.     Right lower leg: No edema.     Left lower leg: No edema.  Lymphadenopathy:     Cervical: No cervical adenopathy.  Skin:    General: Skin is warm and dry.     Findings: No rash.  Neurological:     Mental Status: She is alert and oriented to person, place, and time.  Psychiatric:        Behavior: Behavior normal.        Thought Content: Thought content normal.     Wt Readings from Last 3 Encounters:  07/13/18 204 lb (92.5 kg)  02/13/18 204 lb (92.5 kg)  10/13/17 206 lb (93.4 kg)    BP 122/78   Pulse 79   Resp 16   Ht 5\' 8"  (1.727 m)   Wt 204 lb (92.5 kg)   SpO2 100%   BMI 31.02 kg/m   Assessment and Plan: 1. Essential (primary) hypertension Controlled, continue ACE - lisinopril (ZESTRIL) 20 MG tablet; Take 1 tablet (20 mg total) by mouth daily.  Dispense: 90 tablet; Refill: 3 - Comprehensive metabolic panel - TSH  2. Controlled type 2 diabetes mellitus without complication, without long-term current use of insulin ([place]) Recommend FSBS home monitoring -pt will get her own meter - Hemoglobin A1c - metFORMIN (GLUCOPHAGE-XR) 500 MG 24 hr tablet; Take 2 tablets (1,000 mg total) by mouth daily with breakfast.  Dispense: 180 tablet; Refill: 3  3. Hyperlipidemia associated with type 2 diabetes mellitus ([place]) On statin therapy - atorvastatin (LIPITOR) 10 MG tablet; Take 1 tablet (10 mg total) by mouth daily at 6 PM.  Dispense: 90 tablet; Refill: 3 - Lipid panel    Partially dictated using Editor, commissioning. Any errors are unintentional.  [name], MD [facility]  07/13/2018

## 2018-07-14 LAB — HEMOGLOBIN A1C
Est. average glucose Bld gHb Est-mCnc: 180 mg/dL
Hgb A1c MFr Bld: 7.9 % — ABNORMAL HIGH (ref 4.8–5.6)

## 2018-07-14 LAB — COMPREHENSIVE METABOLIC PANEL
ALT: 13 IU/L (ref 0–32)
AST: 10 IU/L (ref 0–40)
Albumin/Globulin Ratio: 2.3 — ABNORMAL HIGH (ref 1.2–2.2)
Albumin: 4.6 g/dL (ref 3.8–4.8)
Alkaline Phosphatase: 105 IU/L (ref 39–117)
BUN/Creatinine Ratio: 13 (ref 9–23)
BUN: 8 mg/dL (ref 6–24)
Bilirubin Total: 0.4 mg/dL (ref 0.0–1.2)
CO2: 20 mmol/L (ref 20–29)
Calcium: 9.6 mg/dL (ref 8.7–10.2)
Chloride: 100 mmol/L (ref 96–106)
Creatinine, Ser: 0.64 mg/dL (ref 0.57–1.00)
GFR calc Af Amer: 126 mL/min/{1.73_m2} (ref >59)
GFR calc non Af Amer: 109 mL/min/{1.73_m2} (ref >59)
Globulin, Total: 2 g/dL (ref 1.5–4.5)
Glucose: 162 mg/dL — ABNORMAL HIGH (ref 65–99)
Potassium: 4.4 mmol/L (ref 3.5–5.2)
Sodium: 135 mmol/L (ref 134–144)
Total Protein: 6.6 g/dL (ref 6.0–8.5)

## 2018-07-14 LAB — LIPID PANEL
Chol/HDL Ratio: 3.5 ratio (ref 0.0–4.4)
Cholesterol, Total: 116 mg/dL (ref 100–199)
HDL: 33 mg/dL — ABNORMAL LOW (ref >39)
LDL Calculated: 58 mg/dL (ref 0–99)
Triglycerides: 124 mg/dL (ref 0–149)
VLDL Cholesterol Cal: 25 mg/dL (ref 5–40)

## 2018-07-14 LAB — TSH: TSH: 3.08 u[IU]/mL (ref 0.450–4.500)

## 2018-10-25 ENCOUNTER — Ambulatory Visit: Payer: PRIVATE HEALTH INSURANCE | Admitting: Internal Medicine

## 2018-10-26 ENCOUNTER — Other Ambulatory Visit: Payer: Self-pay

## 2018-10-26 ENCOUNTER — Encounter: Payer: Self-pay | Admitting: Internal Medicine

## 2018-10-26 ENCOUNTER — Ambulatory Visit: Payer: PRIVATE HEALTH INSURANCE | Admitting: Internal Medicine

## 2018-10-26 VITALS — BP 124/82 | HR 77 | Ht 68.0 in | Wt 204.0 lb

## 2018-10-26 DIAGNOSIS — Z23 Encounter for immunization: Secondary | ICD-10-CM | POA: Diagnosis not present

## 2018-10-26 DIAGNOSIS — E118 Type 2 diabetes mellitus with unspecified complications: Secondary | ICD-10-CM

## 2018-10-26 DIAGNOSIS — I10 Essential (primary) hypertension: Secondary | ICD-10-CM | POA: Diagnosis not present

## 2018-10-26 DIAGNOSIS — E1169 Type 2 diabetes mellitus with other specified complication: Secondary | ICD-10-CM | POA: Diagnosis not present

## 2018-10-26 DIAGNOSIS — E785 Hyperlipidemia, unspecified: Secondary | ICD-10-CM

## 2018-10-26 NOTE — Progress Notes (Signed)
Date:  10/26/2018   Name:  Alicia Richard   DOB:  Oct 19, 1975   MRN:  [id number]   Chief Complaint: Diabetes (4 month f/up- foot exam.) and Immunizations (Reg dose flu shot.) Pt is due for Mammogram, foot exam and Eye exam.  Diabetes She presents for her follow-up diabetic visit. She has type 2 diabetes mellitus. Her disease course has been stable. Pertinent negatives for hypoglycemia include no headaches or tremors. Pertinent negatives for diabetes include no chest pain, no fatigue, no polydipsia and no polyuria. Current diabetic treatment includes oral agent (dual therapy) Celesta Gentile and metformin). She is compliant with treatment all of the time. Her weight is stable. She is following a generally healthy diet. There is no compliance with monitoring of blood glucose. An ACE inhibitor/angiotensin II receptor blocker is being taken. Eye exam is not current (scheduled).  Hypertension The problem is controlled. Pertinent negatives include no chest pain, headaches, palpitations or shortness of breath. Risk factors for coronary artery disease include diabetes mellitus and dyslipidemia. Past treatments include ACE inhibitors. The current treatment provides significant improvement.  Hyperlipidemia The problem is controlled. Pertinent negatives include no chest pain or shortness of breath. Current antihyperlipidemic treatment includes statins. The current treatment provides significant improvement of lipids. There are no compliance problems.    Lab Results  Component Value Date   HGBA1C 7.9 (H) 07/13/2018   Lab Results  Component Value Date   CREATININE 0.64 07/13/2018   BUN 8 07/13/2018   NA 135 07/13/2018   K 4.4 07/13/2018   CL 100 07/13/2018   CO2 20 07/13/2018   Lab Results  Component Value Date   CHOL 116 07/13/2018   HDL 33 (L) 07/13/2018   LDLCALC 58 07/13/2018   TRIG 124 07/13/2018   CHOLHDL 3.5 07/13/2018     Review of Systems  Constitutional: Negative for appetite change,  fatigue, fever and unexpected weight change.  HENT: Negative for tinnitus and trouble swallowing.   Eyes: Negative for visual disturbance.  Respiratory: Negative for cough, chest tightness and shortness of breath.   Cardiovascular: Negative for chest pain, palpitations and leg swelling.  Gastrointestinal: Negative for abdominal pain.  Endocrine: Negative for polydipsia and polyuria.  Genitourinary: Negative for dysuria and hematuria.  Musculoskeletal: Negative for arthralgias.  Neurological: Negative for tremors, numbness and headaches.  Psychiatric/Behavioral: Negative for dysphoric mood.    Patient Active Problem List   Diagnosis Date Noted  . Hyperlipidemia associated with type 2 diabetes mellitus ([place]) 10/11/2014  . Essential (primary) hypertension 08/14/2014  . Type II diabetes mellitus with complication ([place]) [id number]    No Known Allergies  Past Surgical History:  Procedure Laterality Date  . BREAST BIOPSY Right 10/10/2015   Stereotactic biopsy - benign  . WISDOM TOOTH EXTRACTION      Social History   Tobacco Use  . Smoking status: Never Smoker  . Smokeless tobacco: Never Used  Substance Use Topics  . Alcohol use: No    Alcohol/week: 0.0 standard drinks  . Drug use: No     Medication list has been reviewed and updated.  Current Meds  Medication Sig  . atorvastatin (LIPITOR) 10 MG tablet Take 1 tablet (10 mg total) by mouth daily at 6 PM.  . FREESTYLE LITE test strip U UTD ONCE D  . JANUVIA 100 MG tablet TAKE 1 TABLET (100 MG TOTAL) DAILY BY MOUTH.  Marland Kitchen lisinopril (ZESTRIL) 20 MG tablet Take 1 tablet (20 mg total) by mouth daily.  . metFORMIN (GLUCOPHAGE-XR) 500  MG 24 hr tablet Take 2 tablets (1,000 mg total) by mouth daily with breakfast.    PHQ 2/9 Scores 10/26/2018 07/13/2018 10/13/2017 01/11/2017  PHQ - 2 Score 0 0 0 0  PHQ- 9 Score - 0 - -    BP Readings from Last 3 Encounters:  10/26/18 124/82  07/13/18 122/78  02/13/18 112/78    Physical Exam  Vitals signs and nursing note reviewed.  Constitutional:      General: She is not in acute distress.    Appearance: Normal appearance. She is well-developed.  HENT:     Head: Normocephalic and atraumatic.  Neck:     Musculoskeletal: Normal range of motion.  Cardiovascular:     Rate and Rhythm: Normal rate and regular rhythm.     Pulses: Normal pulses.  Pulmonary:     Effort: Pulmonary effort is normal. No respiratory distress.  Musculoskeletal: Normal range of motion.     Right lower leg: No edema.     Left lower leg: No edema.  Lymphadenopathy:     Cervical: No cervical adenopathy.  Skin:    General: Skin is warm and dry.     Capillary Refill: Capillary refill takes less than 2 seconds.     Findings: No rash.  Neurological:     General: No focal deficit present.     Mental Status: She is alert and oriented to person, place, and time.  Psychiatric:        Behavior: Behavior normal.        Thought Content: Thought content normal.     Wt Readings from Last 3 Encounters:  10/26/18 204 lb (92.5 kg)  07/13/18 204 lb (92.5 kg)  02/13/18 204 lb (92.5 kg)    BP 124/82   Pulse 77   Ht 5\' 8"  (1.727 m)   Wt 204 lb (92.5 kg)   SpO2 98%   BMI 31.02 kg/m   Assessment and Plan: 1. Type II diabetes mellitus with complication (HCC) Clinically stable by exam and report without s/s of hypoglycemia. DM complicated by HTN, lipids. Tolerating medications Januvia and metformin 1000 mg per day well without side effects or other concerns. If A1C is higher will increase metformin to 2000 mg per day. - Hemoglobin A1c  2. Essential (primary) hypertension Clinically stable exam with well controlled BP.   Tolerating medications, Lisinopril 20 mg , without side effects at this time. Pt to continue current regimen and low sodium diet; benefits of regular exercise as able discussed. - CBC with Differential/Platelet  3. Hyperlipidemia associated with type 2 diabetes mellitus ([place])  Tolerating statin medication without side effects at this time LDL is at goal of < 70 on current dose of atorvastatin 10 mg. Continue same therapy without change at this time.   Partially dictated using Editor, commissioning. Any errors are unintentional.  [name], MD [facility]  10/26/2018

## 2018-10-27 LAB — CBC WITH DIFFERENTIAL/PLATELET
Basophils Absolute: 0.1 10*3/uL (ref 0.0–0.2)
Basos: 1 %
EOS (ABSOLUTE): 0.1 10*3/uL (ref 0.0–0.4)
Eos: 1 %
Hematocrit: 37.3 % (ref 34.0–46.6)
Hemoglobin: 11.7 g/dL (ref 11.1–15.9)
Immature Grans (Abs): 0 10*3/uL (ref 0.0–0.1)
Immature Granulocytes: 0 %
Lymphocytes Absolute: 2.3 10*3/uL (ref 0.7–3.1)
Lymphs: 28 %
MCH: 23.3 pg — ABNORMAL LOW (ref 26.6–33.0)
MCHC: 31.4 g/dL — ABNORMAL LOW (ref 31.5–35.7)
MCV: 74 fL — ABNORMAL LOW (ref 79–97)
Monocytes Absolute: 0.7 10*3/uL (ref 0.1–0.9)
Monocytes: 8 %
Neutrophils Absolute: 5.1 10*3/uL (ref 1.4–7.0)
Neutrophils: 62 %
Platelets: 320 10*3/uL (ref 150–450)
RBC: 5.02 x10E6/uL (ref 3.77–5.28)
RDW: 15.8 % — ABNORMAL HIGH (ref 11.7–15.4)
WBC: 8.2 10*3/uL (ref 3.4–10.8)

## 2018-10-27 LAB — HEMOGLOBIN A1C
Est. average glucose Bld gHb Est-mCnc: 192 mg/dL
Hgb A1c MFr Bld: 8.3 % — ABNORMAL HIGH (ref 4.8–5.6)

## 2018-11-20 ENCOUNTER — Other Ambulatory Visit: Payer: Self-pay | Admitting: Obstetrics and Gynecology

## 2018-11-20 DIAGNOSIS — Z1231 Encounter for screening mammogram for malignant neoplasm of breast: Secondary | ICD-10-CM

## 2018-11-29 LAB — HM DIABETES EYE EXAM

## 2018-12-04 ENCOUNTER — Encounter: Payer: Self-pay | Admitting: Internal Medicine

## 2018-12-27 ENCOUNTER — Ambulatory Visit
Admission: RE | Admit: 2018-12-27 | Discharge: 2018-12-27 | Disposition: A | Discharge status: Home / Self Care | Payer: PRIVATE HEALTH INSURANCE | Source: Ambulatory Visit | Attending: Obstetrics and Gynecology | Admitting: Obstetrics and Gynecology

## 2018-12-27 ENCOUNTER — Other Ambulatory Visit: Payer: Self-pay

## 2018-12-27 DIAGNOSIS — Z1231 Encounter for screening mammogram for malignant neoplasm of breast: Secondary | ICD-10-CM

## 2019-03-05 ENCOUNTER — Ambulatory Visit: Payer: PRIVATE HEALTH INSURANCE | Admitting: Internal Medicine

## 2019-03-15 ENCOUNTER — Other Ambulatory Visit: Payer: Self-pay

## 2019-03-15 ENCOUNTER — Ambulatory Visit (INDEPENDENT_AMBULATORY_CARE_PROVIDER_SITE_OTHER): Payer: PRIVATE HEALTH INSURANCE | Admitting: Internal Medicine

## 2019-03-15 ENCOUNTER — Encounter: Payer: Self-pay | Admitting: Internal Medicine

## 2019-03-15 VITALS — BP 108/64 | HR 80 | Temp 97.9°F | Ht 68.0 in | Wt 200.0 lb

## 2019-03-15 DIAGNOSIS — I10 Essential (primary) hypertension: Secondary | ICD-10-CM

## 2019-03-15 DIAGNOSIS — E118 Type 2 diabetes mellitus with unspecified complications: Secondary | ICD-10-CM

## 2019-03-15 MED ORDER — SITAGLIPTIN PHOSPHATE 100 MG PO TABS: 100.0000 mg | ORAL_TABLET | Freq: Every day | ORAL | 3 refills | Status: DC

## 2019-03-15 MED ORDER — METFORMIN HCL ER 500 MG PO TB24: 1000.0000 mg | ORAL_TABLET | Freq: Two times a day (BID) | ORAL | 3 refills | Status: DC

## 2019-03-15 NOTE — Progress Notes (Signed)
Date:  03/15/2019   Name:  Alicia Richard   DOB:  13-Apr-1975   MRN:  [id number]   Chief Complaint: Hypertension (4 month follow up. ) and Diabetes  Hypertension This is a chronic problem. The problem is controlled. Pertinent negatives include no chest pain, headaches, palpitations or shortness of breath. Past treatments include ACE inhibitors. The current treatment provides significant improvement.  Diabetes She presents for her follow-up diabetic visit. She has type 2 diabetes mellitus. Pertinent negatives for hypoglycemia include no headaches or tremors. Pertinent negatives for diabetes include no chest pain, no fatigue, no polydipsia and no polyuria. Symptoms are stable. Current diabetic treatment includes oral agent (dual therapy) (metformin increased to 2 tabs bid last visit). She is compliant with treatment all of the time. Her weight is stable. She is following a generally healthy diet. Her breakfast blood glucose is taken between 6-7 am. Her breakfast blood glucose range is generally 140-180 mg/dl. An ACE inhibitor/angiotensin II receptor blocker is being taken.    Lab Results  Component Value Date   CREATININE 0.64 07/13/2018   BUN 8 07/13/2018   NA 135 07/13/2018   K 4.4 07/13/2018   CL 100 07/13/2018   CO2 20 07/13/2018   Lab Results  Component Value Date   CHOL 116 07/13/2018   HDL 33 (L) 07/13/2018   LDLCALC 58 07/13/2018   TRIG 124 07/13/2018   CHOLHDL 3.5 07/13/2018   Lab Results  Component Value Date   TSH 3.080 07/13/2018   Lab Results  Component Value Date   HGBA1C 8.3 (H) 10/26/2018     Review of Systems  Constitutional: Negative for appetite change, fatigue, fever and unexpected weight change.  HENT: Negative for tinnitus and trouble swallowing.   Eyes: Negative for visual disturbance.  Respiratory: Negative for cough, chest tightness and shortness of breath.   Cardiovascular: Negative for chest pain, palpitations and leg swelling.  Gastrointestinal:  Negative for abdominal pain.  Endocrine: Negative for polydipsia and polyuria.  Genitourinary: Negative for dysuria and hematuria.  Musculoskeletal: Negative for arthralgias.  Neurological: Negative for tremors, numbness and headaches.  Psychiatric/Behavioral: Negative for dysphoric mood.    Patient Active Problem List   Diagnosis Date Noted  . Hyperlipidemia associated with type 2 diabetes mellitus ([place]) 10/11/2014  . Essential (primary) hypertension 08/14/2014  . Type II diabetes mellitus with complication ([place]) [id number]    No Known Allergies  Past Surgical History:  Procedure Laterality Date  . BREAST BIOPSY Right 10/10/2015   Stereotactic biopsy - benign  . WISDOM TOOTH EXTRACTION      Social History   Tobacco Use  . Smoking status: Never Smoker  . Smokeless tobacco: Never Used  Substance Use Topics  . Alcohol use: No    Alcohol/week: 0.0 standard drinks  . Drug use: No     Medication list has been reviewed and updated.  Current Meds  Medication Sig  . atorvastatin (LIPITOR) 10 MG tablet Take 1 tablet (10 mg total) by mouth daily at 6 PM.  . FREESTYLE LITE test strip U UTD ONCE D  . JANUVIA 100 MG tablet TAKE 1 TABLET (100 MG TOTAL) DAILY BY MOUTH.  Marland Kitchen lisinopril (ZESTRIL) 20 MG tablet Take 1 tablet (20 mg total) by mouth daily.    PHQ 2/9 Scores 03/15/2019 10/26/2018 07/13/2018 10/13/2017  PHQ - 2 Score 0 0 0 0  PHQ- 9 Score - - 0 -    BP Readings from Last 3 Encounters:  03/15/19 108/64  10/26/18  124/82  07/13/18 122/78    Physical Exam Vitals and nursing note reviewed.  Constitutional:      General: She is not in acute distress.    Appearance: She is well-developed.  HENT:     Head: Normocephalic and atraumatic.  Cardiovascular:     Rate and Rhythm: Normal rate and regular rhythm.     Heart sounds: No murmur.  Pulmonary:     Effort: Pulmonary effort is normal. No respiratory distress.     Breath sounds: No wheezing or rhonchi.  Musculoskeletal:      Cervical back: Normal range of motion.     Right lower leg: No edema.     Left lower leg: No edema.  Skin:    General: Skin is warm and dry.     Findings: No rash.  Neurological:     Mental Status: She is alert and oriented to person, place, and time.  Psychiatric:        Behavior: Behavior normal.        Thought Content: Thought content normal.     Wt Readings from Last 3 Encounters:  03/15/19 200 lb (90.7 kg)  10/26/18 204 lb (92.5 kg)  07/13/18 204 lb (92.5 kg)    BP 108/64   Pulse 80   Temp 97.9 F (36.6 C) (Oral)   Ht 5\' 8"  (1.727 m)   Wt 200 lb (90.7 kg)   LMP 03/14/2019 (Exact Date)   SpO2 100%   BMI 30.41 kg/m   Assessment and Plan: 1. Type II diabetes mellitus with complication (HCC) Ran out of 1000 mg bid a month ago so has been taking 1000 mg once a day BS range 140's.  No issues with higher dose of metformin. Will increase to 1000 mg bid and continue Januvia. - metFORMIN (GLUCOPHAGE-XR) 500 MG 24 hr tablet; Take 2 tablets (1,000 mg total) by mouth 2 (two) times daily.  Dispense: 360 tablet; Refill: 3 - sitaGLIPtin (JANUVIA) 100 MG tablet; Take 1 tablet (100 mg total) by mouth daily.  Dispense: 90 tablet; Refill: 3 - Hemoglobin [id number] - Basic metabolic panel  2. Essential (primary) hypertension Clinically stable exam with well controlled BP on lisinopril. Tolerating medications without side effects at this time. Pt to continue current regimen and low sodium diet; benefits of regular exercise as able discussed.   Partially dictated using Editor, commissioning. Any errors are unintentional.  [name], MD [facility]  03/15/2019

## 2019-03-16 LAB — BASIC METABOLIC PANEL
BUN/Creatinine Ratio: 13 (ref 9–23)
BUN: 8 mg/dL (ref 6–24)
CO2: 22 mmol/L (ref 20–29)
Calcium: 9.4 mg/dL (ref 8.7–10.2)
Chloride: 101 mmol/L (ref 96–106)
Creatinine, Ser: 0.63 mg/dL (ref 0.57–1.00)
GFR calc Af Amer: 126 mL/min/{1.73_m2} (ref >59)
GFR calc non Af Amer: 109 mL/min/{1.73_m2} (ref >59)
Glucose: 142 mg/dL — ABNORMAL HIGH (ref 65–99)
Potassium: 4.6 mmol/L (ref 3.5–5.2)
Sodium: 138 mmol/L (ref 134–144)

## 2019-03-16 LAB — HEMOGLOBIN A1C
Est. average glucose Bld gHb Est-mCnc: 166 mg/dL
Hgb A1c MFr Bld: 7.4 % — ABNORMAL HIGH (ref 4.8–5.6)

## 2019-07-03 ENCOUNTER — Encounter: Payer: Self-pay | Admitting: Internal Medicine

## 2019-07-03 ENCOUNTER — Other Ambulatory Visit: Payer: Self-pay

## 2019-07-03 ENCOUNTER — Ambulatory Visit (INDEPENDENT_AMBULATORY_CARE_PROVIDER_SITE_OTHER): Payer: PRIVATE HEALTH INSURANCE | Admitting: Internal Medicine

## 2019-07-03 VITALS — BP 102/64 | HR 62 | Temp 97.0°F | Ht 68.0 in | Wt 194.0 lb

## 2019-07-03 DIAGNOSIS — E1169 Type 2 diabetes mellitus with other specified complication: Secondary | ICD-10-CM

## 2019-07-03 DIAGNOSIS — E785 Hyperlipidemia, unspecified: Secondary | ICD-10-CM

## 2019-07-03 DIAGNOSIS — E118 Type 2 diabetes mellitus with unspecified complications: Secondary | ICD-10-CM

## 2019-07-03 DIAGNOSIS — I10 Essential (primary) hypertension: Secondary | ICD-10-CM | POA: Diagnosis not present

## 2019-07-03 MED ORDER — ATORVASTATIN CALCIUM 10 MG PO TABS: 10.0000 mg | ORAL_TABLET | Freq: Every day | ORAL | 3 refills | Status: DC

## 2019-07-03 MED ORDER — LISINOPRIL 20 MG PO TABS: 20.0000 mg | ORAL_TABLET | Freq: Every day | ORAL | 3 refills | Status: DC

## 2019-07-03 NOTE — Progress Notes (Signed)
Date:  07/03/2019   Name:  Alicia Richard   DOB:  December 16, 1975   MRN:  [id number]   Chief Complaint: Diabetes (Follow up.) and Hypertension  Diabetes She presents for her follow-up diabetic visit. She has type 2 diabetes mellitus. Pertinent negatives for hypoglycemia include no headaches or tremors. Pertinent negatives for diabetes include no chest pain, no fatigue, no polydipsia and no polyuria. Pertinent negatives for diabetic complications include no CVA. Current diabetic treatment includes oral agent (dual therapy) (metformin and januvia). She is compliant with treatment all of the time. She monitors blood glucose at home 1-2 x per week. Her breakfast blood glucose is taken between 6-7 am. Her breakfast blood glucose range is generally 130-140 mg/dl. An ACE inhibitor/angiotensin II receptor blocker is being taken. Eye exam is current.  Hypertension This is a chronic problem. The problem is controlled. Pertinent negatives include no chest pain, headaches, palpitations or shortness of breath. Past treatments include ACE inhibitors. The current treatment provides significant improvement. There is no history of kidney disease, CAD/MI or CVA.  Hyperlipidemia This is a chronic problem. The problem is controlled. Pertinent negatives include no chest pain or shortness of breath. The current treatment provides significant improvement of lipids. Risk factors for coronary artery disease include diabetes mellitus and hypertension.   Immunization History  Administered Date(s) Administered  . Influenza,inj,Quad PF,6+ Mos 11/27/2014, 10/26/2018  . Influenza,inj,quad, With Preservative 12/07/2017  . Influenza-Unspecified 12/06/2016, 12/11/2017  . PFIZER SARS-COV-2 Vaccination 05/18/2019, 06/08/2019  . Pneumococcal Polysaccharide-23 09/01/2016     Lab Results  Component Value Date   CREATININE 0.63 03/15/2019   BUN 8 03/15/2019   NA 138 03/15/2019   K 4.6 03/15/2019   CL 101 03/15/2019   CO2 22  03/15/2019   Lab Results  Component Value Date   CHOL 116 07/13/2018   HDL 33 (L) 07/13/2018   LDLCALC 58 07/13/2018   TRIG 124 07/13/2018   CHOLHDL 3.5 07/13/2018   Lab Results  Component Value Date   TSH 3.080 07/13/2018   Lab Results  Component Value Date   HGBA1C 7.4 (H) 03/15/2019   Lab Results  Component Value Date   WBC 8.2 10/26/2018   HGB 11.7 10/26/2018   HCT 37.3 10/26/2018   MCV 74 (L) 10/26/2018   PLT 320 10/26/2018   Lab Results  Component Value Date   ALT 13 07/13/2018   AST 10 07/13/2018   ALKPHOS 105 07/13/2018   BILITOT 0.4 07/13/2018     Review of Systems  Constitutional: Negative for appetite change, fatigue, fever and unexpected weight change.  HENT: Negative for tinnitus and trouble swallowing.   Eyes: Negative for visual disturbance.  Respiratory: Negative for cough, chest tightness and shortness of breath.   Cardiovascular: Negative for chest pain, palpitations and leg swelling.  Gastrointestinal: Negative for abdominal pain.  Endocrine: Negative for polydipsia and polyuria.  Genitourinary: Negative for dysuria and hematuria.  Musculoskeletal: Negative for arthralgias.  Neurological: Negative for tremors, numbness and headaches.  Psychiatric/Behavioral: Negative for dysphoric mood.    Patient Active Problem List   Diagnosis Date Noted  . Hyperlipidemia associated with type 2 diabetes mellitus ([place]) 10/11/2014  . Essential (primary) hypertension 08/14/2014  . Type II diabetes mellitus with complication ([place]) [id number]    No Known Allergies  Past Surgical History:  Procedure Laterality Date  . BREAST BIOPSY Right 10/10/2015   Stereotactic biopsy - benign  . WISDOM TOOTH EXTRACTION      Social History   Tobacco Use  .  Smoking status: Never Smoker  . Smokeless tobacco: Never Used  Substance Use Topics  . Alcohol use: No    Alcohol/week: 0.0 standard drinks  . Drug use: No     Medication list has been reviewed and  updated.  Current Meds  Medication Sig  . atorvastatin (LIPITOR) 10 MG tablet Take 1 tablet (10 mg total) by mouth daily at 6 PM.  . FREESTYLE LITE test strip U UTD ONCE D  . lisinopril (ZESTRIL) 20 MG tablet Take 1 tablet (20 mg total) by mouth daily.  . metFORMIN (GLUCOPHAGE-XR) 500 MG 24 hr tablet Take 2 tablets (1,000 mg total) by mouth 2 (two) times daily.  . sitaGLIPtin (JANUVIA) 100 MG tablet Take 1 tablet (100 mg total) by mouth daily.    PHQ 2/9 Scores 07/03/2019 03/15/2019 10/26/2018 07/13/2018  PHQ - 2 Score 0 0 0 0  PHQ- 9 Score 0 - - 0    BP Readings from Last 3 Encounters:  07/03/19 102/64  03/15/19 108/64  10/26/18 124/82    Physical Exam Vitals and nursing note reviewed.  Constitutional:      General: She is not in acute distress.    Appearance: She is well-developed.  HENT:     Head: Normocephalic and atraumatic.  Cardiovascular:     Rate and Rhythm: Normal rate and regular rhythm.     Pulses: Normal pulses.     Heart sounds: No murmur.  Pulmonary:     Effort: Pulmonary effort is normal. No respiratory distress.     Breath sounds: No wheezing or rhonchi.  Musculoskeletal:        General: Normal range of motion.     Cervical back: Normal range of motion.     Right lower leg: No edema.     Left lower leg: No edema.  Lymphadenopathy:     Cervical: No cervical adenopathy.  Skin:    General: Skin is warm and dry.     Capillary Refill: Capillary refill takes less than 2 seconds.     Findings: No rash.  Neurological:     General: No focal deficit present.     Mental Status: She is alert and oriented to person, place, and time.  Psychiatric:        Behavior: Behavior normal.        Thought Content: Thought content normal.     Wt Readings from Last 3 Encounters:  07/03/19 194 lb (88 kg)  03/15/19 200 lb (90.7 kg)  10/26/18 204 lb (92.5 kg)    BP 102/64   Pulse 62   Temp (!) 97 F (36.1 C) (Temporal)   Ht 5\' 8"  (1.727 m)   Wt 194 lb (88 kg)   LMP  02/23/2019 (Approximate)   SpO2 98%   BMI 29.50 kg/m   Assessment and Plan: 1. Type II diabetes mellitus with complication (HCC) Clinically stable by exam and report without s/s of hypoglycemia. Has lost 6 lb since last visit with diet and exercise; feels well DM complicated by htn. Tolerating medications well without side effects or other concerns. - Comprehensive metabolic panel - Hemoglobin A1c  2. Essential (primary) hypertension Clinically stable exam with well controlled BP on lisinopril. Tolerating medications without side effects at this time. Pt to continue current regimen and low sodium diet; benefits of regular exercise as able discussed. - lisinopril (ZESTRIL) 20 MG tablet; Take 1 tablet (20 mg total) by mouth daily.  Dispense: 90 tablet; Refill: 3 - TSH  3. Hyperlipidemia associated  with type 2 diabetes mellitus ([place]) Tolerating statin medication without side effects at this time LDL is at goal of < 70 on current dose Continue same therapy without change at this time - atorvastatin (LIPITOR) 10 MG tablet; Take 1 tablet (10 mg total) by mouth daily at 6 PM.  Dispense: 90 tablet; Refill: 3 - Lipid panel   Partially dictated using Editor, commissioning. Any errors are unintentional.  [name], MD [facility]  07/03/2019

## 2019-07-04 LAB — COMPREHENSIVE METABOLIC PANEL
ALT: 19 IU/L (ref 0–32)
AST: 18 IU/L (ref 0–40)
Albumin/Globulin Ratio: 1.9 (ref 1.2–2.2)
Albumin: 4.4 g/dL (ref 3.8–4.8)
Alkaline Phosphatase: 118 IU/L — ABNORMAL HIGH (ref 39–117)
BUN/Creatinine Ratio: 10 (ref 9–23)
BUN: 6 mg/dL (ref 6–24)
Bilirubin Total: 0.6 mg/dL (ref 0.0–1.2)
CO2: 24 mmol/L (ref 20–29)
Calcium: 10.1 mg/dL (ref 8.7–10.2)
Chloride: 102 mmol/L (ref 96–106)
Creatinine, Ser: 0.63 mg/dL (ref 0.57–1.00)
GFR calc Af Amer: 125 mL/min/{1.73_m2} (ref >59)
GFR calc non Af Amer: 109 mL/min/{1.73_m2} (ref >59)
Globulin, Total: 2.3 g/dL (ref 1.5–4.5)
Glucose: 117 mg/dL — ABNORMAL HIGH (ref 65–99)
Potassium: 4.5 mmol/L (ref 3.5–5.2)
Sodium: 141 mmol/L (ref 134–144)
Total Protein: 6.7 g/dL (ref 6.0–8.5)

## 2019-07-04 LAB — LIPID PANEL
Chol/HDL Ratio: 3.1 ratio (ref 0.0–4.4)
Cholesterol, Total: 122 mg/dL (ref 100–199)
HDL: 39 mg/dL — ABNORMAL LOW (ref >39)
LDL Chol Calc (NIH): 58 mg/dL (ref 0–99)
Triglycerides: 141 mg/dL (ref 0–149)
VLDL Cholesterol Cal: 25 mg/dL (ref 5–40)

## 2019-07-04 LAB — HEMOGLOBIN A1C
Est. average glucose Bld gHb Est-mCnc: 174 mg/dL
Hgb A1c MFr Bld: 7.7 % — ABNORMAL HIGH (ref 4.8–5.6)

## 2019-07-04 LAB — TSH: TSH: 2.23 u[IU]/mL (ref 0.450–4.500)

## 2019-10-03 ENCOUNTER — Ambulatory Visit: Payer: PRIVATE HEALTH INSURANCE | Admitting: Internal Medicine

## 2019-11-13 ENCOUNTER — Ambulatory Visit (INDEPENDENT_AMBULATORY_CARE_PROVIDER_SITE_OTHER): Payer: PRIVATE HEALTH INSURANCE | Admitting: Internal Medicine

## 2019-11-13 ENCOUNTER — Encounter: Payer: Self-pay | Admitting: Internal Medicine

## 2019-11-13 ENCOUNTER — Other Ambulatory Visit: Payer: Self-pay

## 2019-11-13 VITALS — BP 112/64 | HR 69 | Temp 97.7°F | Ht 68.0 in | Wt 203.0 lb

## 2019-11-13 DIAGNOSIS — Z23 Encounter for immunization: Secondary | ICD-10-CM | POA: Diagnosis not present

## 2019-11-13 DIAGNOSIS — R21 Rash and other nonspecific skin eruption: Secondary | ICD-10-CM

## 2019-11-13 DIAGNOSIS — E118 Type 2 diabetes mellitus with unspecified complications: Secondary | ICD-10-CM | POA: Diagnosis not present

## 2019-11-13 DIAGNOSIS — Z1159 Encounter for screening for other viral diseases: Secondary | ICD-10-CM

## 2019-11-13 NOTE — Progress Notes (Signed)
Date:  11/13/2019   Name:  Alicia Richard   DOB:  July 28, 1975   MRN:  [id number]   Chief Complaint: Diabetes (Foot exam, A1C. ), Hypertension, and Immunizations (Reg flu shot.)  Diabetes She presents for her follow-up diabetic visit. She has type 2 diabetes mellitus. Pertinent negatives for hypoglycemia include no headaches or tremors. Pertinent negatives for diabetes include no chest pain, no fatigue, no polydipsia and no polyuria. Symptoms are stable. Current diabetic treatment includes oral agent (dual therapy). She is compliant with treatment all of the time.   Skin change - on left ankle - slightly red coloration, worse at times.  Started after sun exposure. No pain or itching.  No edema.  Lab Results  Component Value Date   CREATININE 0.63 07/03/2019   BUN 6 07/03/2019   NA 141 07/03/2019   K 4.5 07/03/2019   CL 102 07/03/2019   CO2 24 07/03/2019   Lab Results  Component Value Date   CHOL 122 07/03/2019   HDL 39 (L) 07/03/2019   LDLCALC 58 07/03/2019   TRIG 141 07/03/2019   CHOLHDL 3.1 07/03/2019   Lab Results  Component Value Date   TSH 2.230 07/03/2019   Lab Results  Component Value Date   HGBA1C 7.7 (H) 07/03/2019   Lab Results  Component Value Date   WBC 8.2 10/26/2018   HGB 11.7 10/26/2018   HCT 37.3 10/26/2018   MCV 74 (L) 10/26/2018   PLT 320 10/26/2018   Lab Results  Component Value Date   ALT 19 07/03/2019   AST 18 07/03/2019   ALKPHOS 118 (H) 07/03/2019   BILITOT 0.6 07/03/2019     Review of Systems  Constitutional: Negative for appetite change, fatigue, fever and unexpected weight change.  HENT: Negative for tinnitus and trouble swallowing.   Eyes: Negative for visual disturbance.  Respiratory: Negative for cough, chest tightness and shortness of breath.   Cardiovascular: Negative for chest pain, palpitations and leg swelling.  Gastrointestinal: Negative for abdominal pain.  Endocrine: Negative for polydipsia and polyuria.  Genitourinary:  Negative for dysuria and hematuria.  Musculoskeletal: Negative for arthralgias.  Skin: Positive for color change. Negative for rash and wound.  Neurological: Negative for tremors, numbness and headaches.  Psychiatric/Behavioral: Negative for dysphoric mood.    Patient Active Problem List   Diagnosis Date Noted  . Hyperlipidemia associated with type 2 diabetes mellitus ([place]) 10/11/2014  . Essential (primary) hypertension 08/14/2014  . Type II diabetes mellitus with complication ([place]) 92/33/0076    No Known Allergies  Past Surgical History:  Procedure Laterality Date  . BREAST BIOPSY Right 10/10/2015   Stereotactic biopsy - benign  . WISDOM TOOTH EXTRACTION      Social History   Tobacco Use  . Smoking status: Never Smoker  . Smokeless tobacco: Never Used  Vaping Use  . Vaping Use: Never used  Substance Use Topics  . Alcohol use: No    Alcohol/week: 0.0 standard drinks  . Drug use: No     Medication list has been reviewed and updated.  Current Meds  Medication Sig  . atorvastatin (LIPITOR) 10 MG tablet Take 1 tablet (10 mg total) by mouth daily at 6 PM.  . FREESTYLE LITE test strip U UTD ONCE D  . lisinopril (ZESTRIL) 20 MG tablet Take 1 tablet (20 mg total) by mouth daily.  . metFORMIN (GLUCOPHAGE-XR) 500 MG 24 hr tablet Take 2 tablets (1,000 mg total) by mouth 2 (two) times daily.  . sitaGLIPtin (JANUVIA)  100 MG tablet Take 1 tablet (100 mg total) by mouth daily.    PHQ 2/9 Scores 07/03/2019 03/15/2019 10/26/2018 07/13/2018  PHQ - 2 Score 0 0 0 0  PHQ- 9 Score 0 - - 0    GAD 7 : Generalized Anxiety Score 07/03/2019  Nervous, Anxious, on Edge 1  Control/stop worrying 0  Worry too much - different things 0  Trouble relaxing 0  Restless 0  Easily annoyed or irritable 0  Afraid - awful might happen 0  Total GAD 7 Score 1  Anxiety Difficulty Not difficult at all    BP Readings from Last 3 Encounters:  11/13/19 112/64  07/03/19 102/64  03/15/19 108/64     Physical Exam Vitals and nursing note reviewed.  Constitutional:      General: She is not in acute distress.    Appearance: She is well-developed.  HENT:     Head: Normocephalic and atraumatic.  Cardiovascular:     Rate and Rhythm: Normal rate and regular rhythm.     Pulses: Normal pulses.     Heart sounds: No murmur heard.   Pulmonary:     Effort: Pulmonary effort is normal. No respiratory distress.     Breath sounds: No wheezing or rhonchi.  Musculoskeletal:     Cervical back: Normal range of motion.     Right lower leg: No edema.     Left lower leg: No edema.  Lymphadenopathy:     Cervical: No cervical adenopathy.  Skin:    General: Skin is warm and dry.     Findings: No rash.          Comments: Area of slightly pink coloration, blanches with pressure with no other abnormality.  Neurological:     Mental Status: She is alert and oriented to person, place, and time.  Psychiatric:        Behavior: Behavior normal.        Thought Content: Thought content normal.     Wt Readings from Last 3 Encounters:  11/13/19 203 lb (92.1 kg)  07/03/19 194 lb (88 kg)  03/15/19 200 lb (90.7 kg)    BP 112/64   Pulse 69   Temp 97.7 F (36.5 C) (Oral)   Ht 5\' 8"  (1.727 m)   Wt 203 lb (92.1 kg)   SpO2 98%   BMI 30.87 kg/m   Assessment and Plan: 1. Type II diabetes mellitus with complication (HCC) Clinically stable by exam and report without s/s of hypoglycemia. DM complicated by lipids. Recommend more regular testing at home Tolerating medications well without side effects or other concerns.  If A1C is not improved, consider adding Farxiga or Ozempic - Hemoglobin A1c  2. Need for hepatitis C screening test - Hepatitis C antibody  3. Need for immunization against influenza - Flu Vaccine QUAD 36+ mos IM  4. Rash and nonspecific skin eruption Appears benign - follow up if worsening or other sx develop   Partially dictated using Editor, commissioning. Any errors are  unintentional.  [name], MD [facility]  11/13/2019

## 2019-11-14 ENCOUNTER — Other Ambulatory Visit
Admission: RE | Admit: 2019-11-14 | Discharge: 2019-11-14 | Disposition: A | Discharge status: Home / Self Care | Payer: PRIVATE HEALTH INSURANCE | Attending: Internal Medicine | Admitting: Internal Medicine

## 2019-11-14 ENCOUNTER — Other Ambulatory Visit: Payer: Self-pay | Admitting: Internal Medicine

## 2019-11-14 ENCOUNTER — Other Ambulatory Visit: Payer: Self-pay

## 2019-11-14 DIAGNOSIS — Z1159 Encounter for screening for other viral diseases: Secondary | ICD-10-CM | POA: Insufficient documentation

## 2019-11-14 DIAGNOSIS — E118 Type 2 diabetes mellitus with unspecified complications: Secondary | ICD-10-CM | POA: Insufficient documentation

## 2019-11-14 LAB — HEPATITIS C ANTIBODY: HCV Ab: NONREACTIVE

## 2019-11-14 LAB — HEMOGLOBIN A1C
Hgb A1c MFr Bld: 8.4 % — ABNORMAL HIGH (ref 4.8–5.6)
Mean Plasma Glucose: 194.38 mg/dL

## 2019-11-14 MED ORDER — DAPAGLIFLOZIN PROPANEDIOL 10 MG PO TABS: 10.0000 mg | ORAL_TABLET | Freq: Every day | ORAL | 1 refills | Status: DC

## 2019-11-27 ENCOUNTER — Other Ambulatory Visit: Payer: Self-pay | Admitting: Obstetrics and Gynecology

## 2019-11-27 DIAGNOSIS — Z1231 Encounter for screening mammogram for malignant neoplasm of breast: Secondary | ICD-10-CM

## 2019-12-28 ENCOUNTER — Ambulatory Visit
Admission: RE | Admit: 2019-12-28 | Discharge: 2019-12-28 | Disposition: A | Discharge status: Home / Self Care | Payer: PRIVATE HEALTH INSURANCE | Source: Ambulatory Visit | Attending: Obstetrics and Gynecology | Admitting: Obstetrics and Gynecology

## 2019-12-28 ENCOUNTER — Other Ambulatory Visit: Payer: Self-pay

## 2019-12-28 DIAGNOSIS — Z1231 Encounter for screening mammogram for malignant neoplasm of breast: Secondary | ICD-10-CM

## 2020-02-25 ENCOUNTER — Telehealth: Payer: Self-pay

## 2020-02-25 NOTE — Telephone Encounter (Signed)
Called patient and left a VM asking her to reschedule her DM appt this week to another day. Awaiting call back to reschedule.

## 2020-02-28 ENCOUNTER — Ambulatory Visit: Payer: PRIVATE HEALTH INSURANCE | Admitting: Internal Medicine

## 2020-03-07 ENCOUNTER — Encounter: Payer: Self-pay | Admitting: Internal Medicine

## 2020-03-07 ENCOUNTER — Ambulatory Visit (INDEPENDENT_AMBULATORY_CARE_PROVIDER_SITE_OTHER): Payer: PRIVATE HEALTH INSURANCE | Admitting: Internal Medicine

## 2020-03-07 ENCOUNTER — Other Ambulatory Visit: Payer: Self-pay

## 2020-03-07 VITALS — BP 126/84 | HR 85 | Temp 97.9°F | Ht 68.0 in | Wt 203.0 lb

## 2020-03-07 DIAGNOSIS — E118 Type 2 diabetes mellitus with unspecified complications: Secondary | ICD-10-CM

## 2020-03-07 DIAGNOSIS — I10 Essential (primary) hypertension: Secondary | ICD-10-CM | POA: Diagnosis not present

## 2020-03-07 MED ORDER — METFORMIN HCL ER 500 MG PO TB24: 1000.0000 mg | ORAL_TABLET | Freq: Two times a day (BID) | ORAL | 3 refills | Status: DC

## 2020-03-07 MED ORDER — SITAGLIPTIN PHOSPHATE 100 MG PO TABS: 100.0000 mg | ORAL_TABLET | Freq: Every day | ORAL | 3 refills | Status: DC

## 2020-03-07 MED ORDER — DAPAGLIFLOZIN PROPANEDIOL 10 MG PO TABS: 10.0000 mg | ORAL_TABLET | Freq: Every day | ORAL | 1 refills | Status: DC

## 2020-03-07 NOTE — Progress Notes (Signed)
Date:  03/07/2020   Name:  Alicia Richard   DOB:  Jun 09, 1975   MRN:  [id number]   Chief Complaint: Diabetes (Last reading this AM was 200)  Diabetes She presents for her follow-up diabetic visit. She has type 2 diabetes mellitus. Her disease course has been stable. There are no hypoglycemic associated symptoms. Pertinent negatives for hypoglycemia include no headaches or tremors. Pertinent negatives for diabetes include no blurred vision, no chest pain, no fatigue, no foot paresthesias, no foot ulcerations, no polydipsia, no polyuria, no visual change and no weight loss. Symptoms are stable. Current diabetic treatment includes oral agent (triple therapy) and oral agent (dual therapy) (farxiga ordered last visit but pt was not aware). She is compliant with treatment all of the time. She is following a generally healthy diet. Her breakfast blood glucose is taken between 6-7 am. Her breakfast blood glucose range is generally 180-200 mg/dl. An ACE inhibitor/angiotensin II receptor blocker is being taken.  Hypertension This is a chronic problem. The problem is controlled. Pertinent negatives include no blurred vision, chest pain, headaches, palpitations or shortness of breath. Past treatments include ACE inhibitors.    Lab Results  Component Value Date   CREATININE 0.63 07/03/2019   BUN 6 07/03/2019   NA 141 07/03/2019   K 4.5 07/03/2019   CL 102 07/03/2019   CO2 24 07/03/2019   Lab Results  Component Value Date   CHOL 122 07/03/2019   HDL 39 (L) 07/03/2019   LDLCALC 58 07/03/2019   TRIG 141 07/03/2019   CHOLHDL 3.1 07/03/2019   Lab Results  Component Value Date   TSH 2.230 07/03/2019   Lab Results  Component Value Date   HGBA1C 8.4 (H) 11/14/2019   Lab Results  Component Value Date   WBC 8.2 10/26/2018   HGB 11.7 10/26/2018   HCT 37.3 10/26/2018   MCV 74 (L) 10/26/2018   PLT 320 10/26/2018   Lab Results  Component Value Date   ALT 19 07/03/2019   AST 18 07/03/2019    ALKPHOS 118 (H) 07/03/2019   BILITOT 0.6 07/03/2019     Review of Systems  Constitutional: Negative for appetite change, fatigue, fever, unexpected weight change and weight loss.  HENT: Negative for tinnitus and trouble swallowing.   Eyes: Negative for blurred vision and visual disturbance.  Respiratory: Negative for cough, chest tightness and shortness of breath.   Cardiovascular: Negative for chest pain, palpitations and leg swelling.  Gastrointestinal: Negative for abdominal pain.  Endocrine: Negative for polydipsia and polyuria.  Genitourinary: Negative for dysuria and hematuria.  Musculoskeletal: Negative for arthralgias.  Neurological: Negative for tremors, numbness and headaches.  Psychiatric/Behavioral: Negative for dysphoric mood.    Patient Active Problem List   Diagnosis Date Noted  . Hyperlipidemia associated with type 2 diabetes mellitus ([place]) 10/11/2014  . Essential (primary) hypertension 08/14/2014  . Type II diabetes mellitus with complication ([place]) 08/65/7846    No Known Allergies  Past Surgical History:  Procedure Laterality Date  . BREAST BIOPSY Right 10/10/2015   Stereotactic biopsy - benign  . WISDOM TOOTH EXTRACTION      Social History   Tobacco Use  . Smoking status: Never Smoker  . Smokeless tobacco: Never Used  Vaping Use  . Vaping Use: Never used  Substance Use Topics  . Alcohol use: No    Alcohol/week: 0.0 standard drinks  . Drug use: No     Medication list has been reviewed and updated.  Current Meds  Medication Sig  .  atorvastatin (LIPITOR) 10 MG tablet Take 1 tablet (10 mg total) by mouth daily at 6 PM.  . dapagliflozin propanediol (FARXIGA) 10 MG TABS tablet Take 1 tablet (10 mg total) by mouth daily before breakfast.  . FREESTYLE LITE test strip U UTD ONCE D  . lisinopril (ZESTRIL) 20 MG tablet Take 1 tablet (20 mg total) by mouth daily.  . metFORMIN (GLUCOPHAGE-XR) 500 MG 24 hr tablet Take 2 tablets (1,000 mg total) by mouth 2  (two) times daily.  . sitaGLIPtin (JANUVIA) 100 MG tablet Take 1 tablet (100 mg total) by mouth daily.    PHQ 2/9 Scores 03/07/2020 07/03/2019 03/15/2019 10/26/2018  PHQ - 2 Score 0 0 0 0  PHQ- 9 Score 0 0 - -    GAD 7 : Generalized Anxiety Score 03/07/2020 07/03/2019  Nervous, Anxious, on Edge 0 1  Control/stop worrying 0 0  Worry too much - different things 0 0  Trouble relaxing 0 0  Restless 0 0  Easily annoyed or irritable 0 0  Afraid - awful might happen 0 0  Total GAD 7 Score 0 1  Anxiety Difficulty - Not difficult at all    BP Readings from Last 3 Encounters:  03/07/20 126/84  11/13/19 112/64  07/03/19 102/64    Physical Exam Vitals and nursing note reviewed.  Constitutional:      General: She is not in acute distress.    Appearance: Normal appearance. She is well-developed.  HENT:     Head: Normocephalic and atraumatic.  Neck:     Vascular: No carotid bruit.  Cardiovascular:     Rate and Rhythm: Normal rate and regular rhythm.  Pulmonary:     Effort: Pulmonary effort is normal. No respiratory distress.     Breath sounds: No wheezing or rhonchi.  Musculoskeletal:     Cervical back: Normal range of motion.     Right lower leg: No edema.     Left lower leg: No edema.  Lymphadenopathy:     Cervical: No cervical adenopathy.  Skin:    General: Skin is warm and dry.     Findings: No rash.  Neurological:     General: No focal deficit present.     Mental Status: She is alert and oriented to person, place, and time.  Psychiatric:        Mood and Affect: Mood and affect and mood normal.        Behavior: Behavior normal.     Wt Readings from Last 3 Encounters:  03/07/20 203 lb (92.1 kg)  11/13/19 203 lb (92.1 kg)  07/03/19 194 lb (88 kg)    BP 126/84   Pulse 85   Temp 97.9 F (36.6 C) (Oral)   Ht 5\' 8"  (1.727 m)   Wt 203 lb (92.1 kg)   SpO2 100%   BMI 30.87 kg/m   Assessment and Plan: 1. Type II diabetes mellitus with complication (HCC) BS are running  high on current therapy. Continue metformin and Januvia Sample and Rx for [place] given  2. Essential (primary) hypertension Clinically stable exam with well controlled BP. Tolerating medications without side effects at this time. Pt to continue current regimen and low sodium diet; benefits of regular exercise as able discussed.   Partially dictated using Editor, commissioning. Any errors are unintentional.  [name], MD [facility]  03/07/2020

## 2020-05-05 LAB — HM DIABETES EYE EXAM

## 2020-05-15 IMAGING — MG MM DIGITAL SCREENING BILAT W/ TOMO W/ CAD
8 of 12 series · 8 of 32 positions shown · non-contrast
Comparison: Previous exam(s).

CLINICAL DATA: Screening.

EXAM:
DIGITAL SCREENING BILATERAL MAMMOGRAM WITH TOMO AND CAD

[R CC synth-2D]
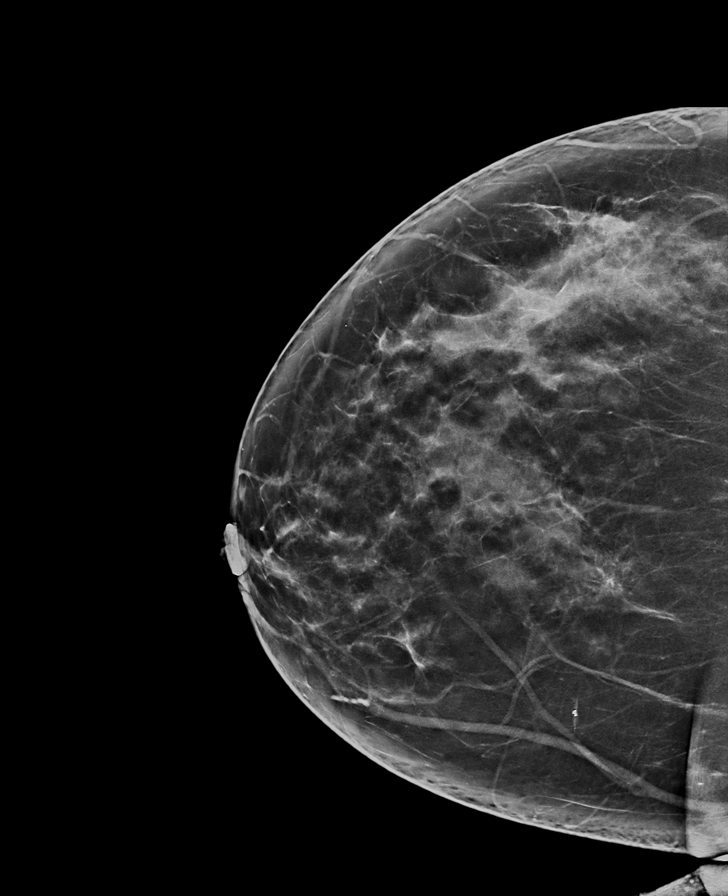

[L MLO synth-2D]
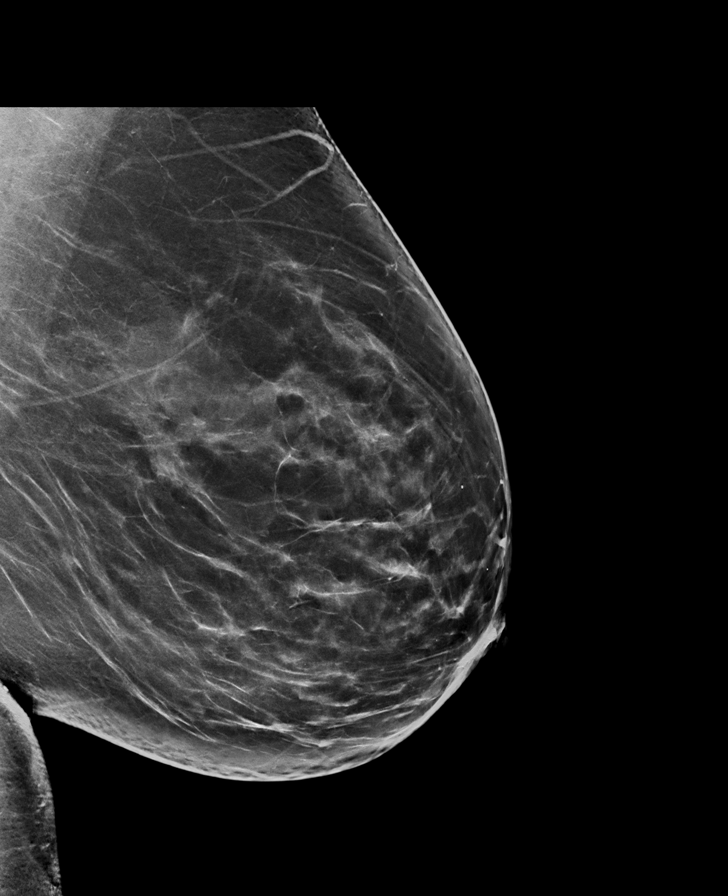

[L CC synth-2D]
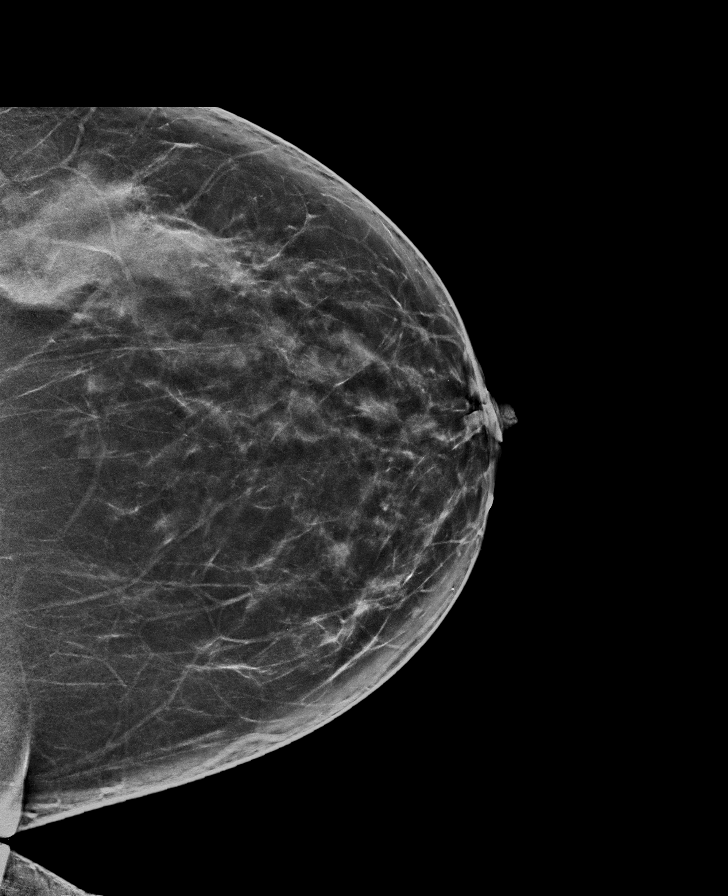

[R CV synth-2D]
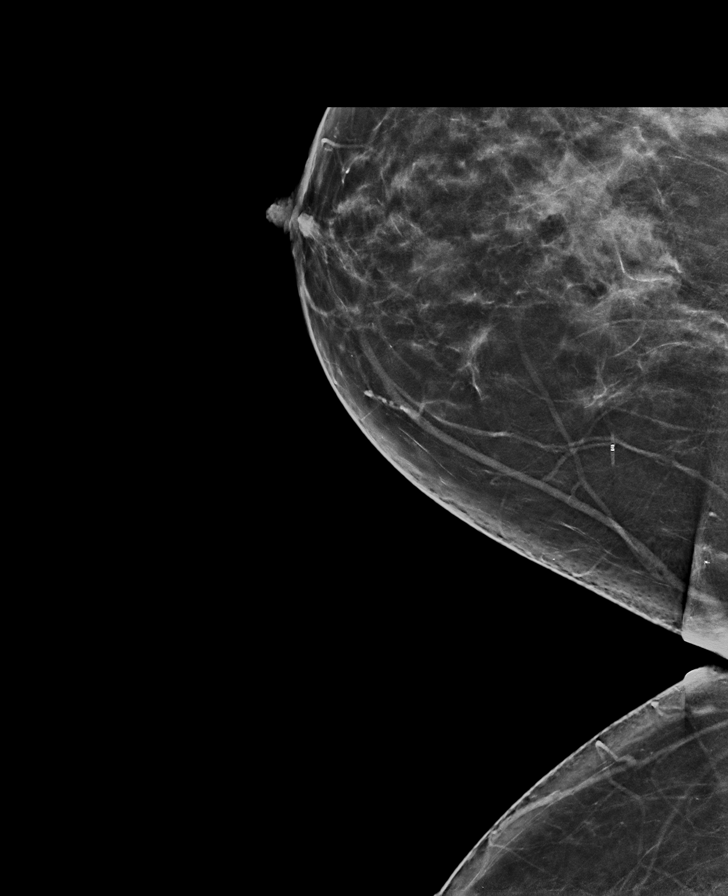

[R MLO synth-2D]
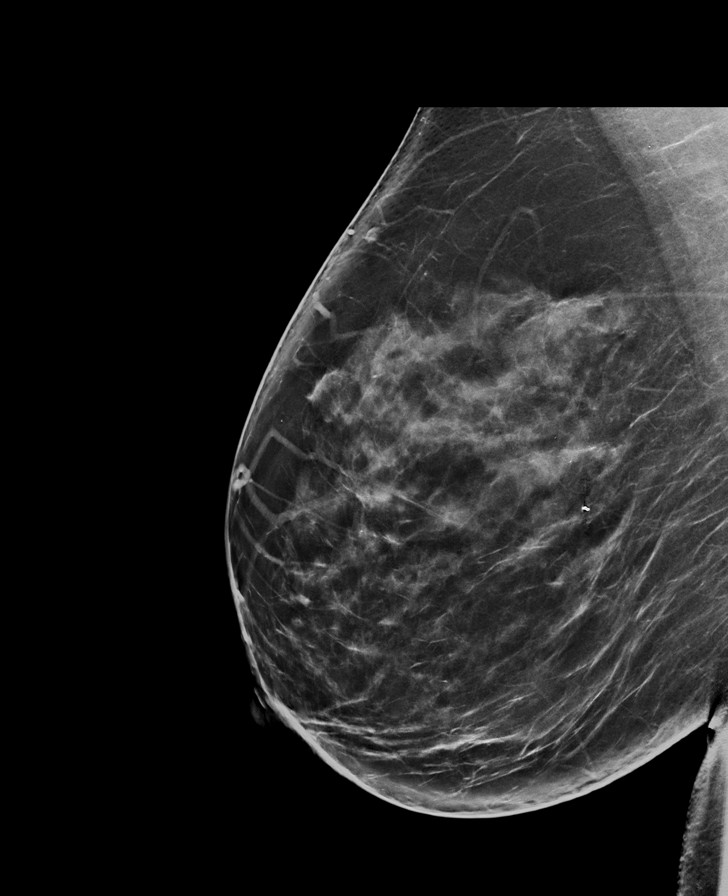

[L CC tomo · tomo slice 41/80.0]
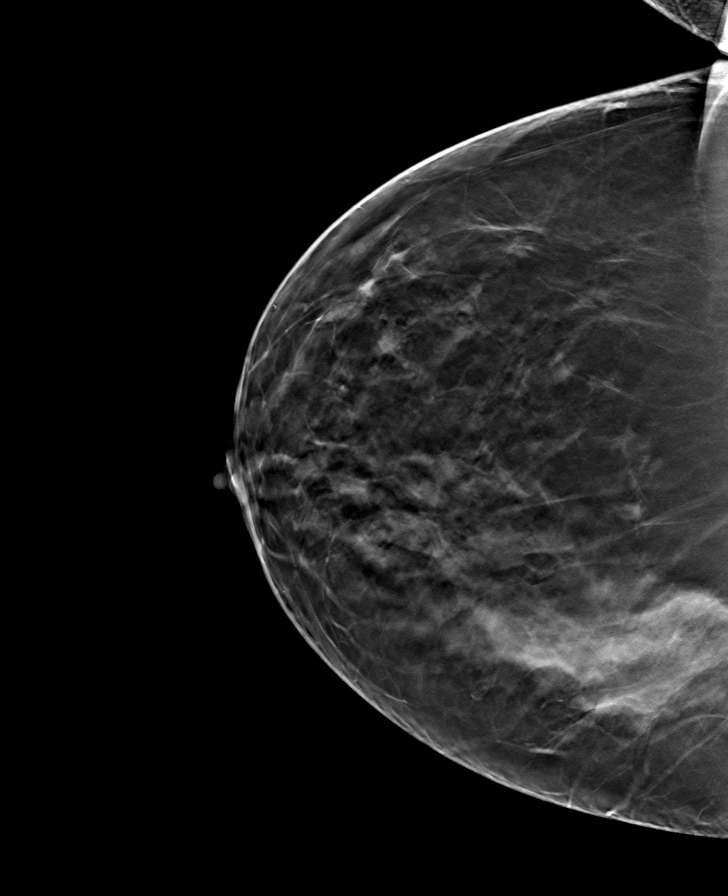

[R CC]
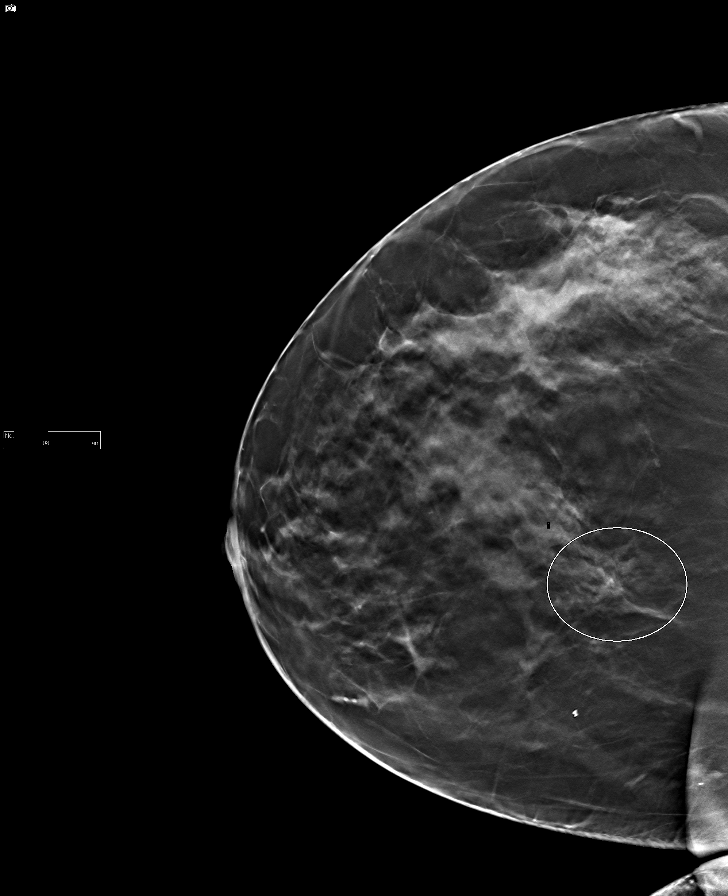

[R MLO]
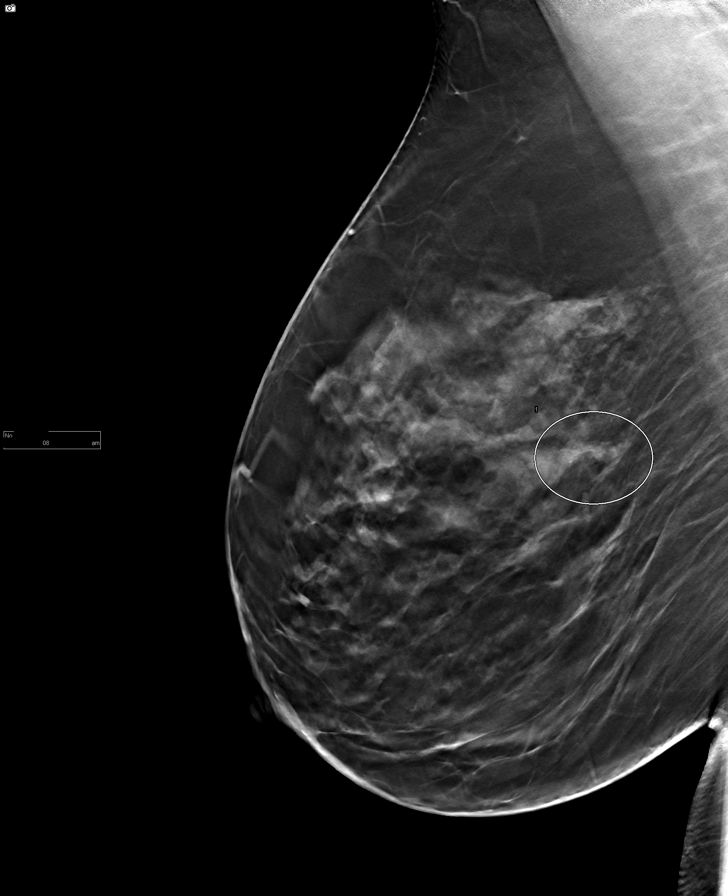

[8 of 32 positions shown; findings below may reference images not displayed]

ACR Breast Density Category c: The breast tissue is heterogeneously
dense, which may obscure small masses.
FINDINGS: In the right breast, a possible mass warrants further evaluation. In
the left breast, no findings suspicious for malignancy. Images were
processed with CAD.
IMPRESSION: Further evaluation is suggested for possible mass in the right
breast.

RECOMMENDATION:
Diagnostic mammogram and possibly ultrasound of the right breast.
(Code:[id number])

The patient will be contacted regarding the findings, and additional
imaging will be scheduled.

BI-RADS CATEGORY  0: Incomplete. Need additional imaging evaluation
and/or prior mammograms for comparison.

## 2020-06-04 ENCOUNTER — Other Ambulatory Visit: Payer: Self-pay

## 2020-06-04 ENCOUNTER — Ambulatory Visit (INDEPENDENT_AMBULATORY_CARE_PROVIDER_SITE_OTHER): Payer: PRIVATE HEALTH INSURANCE | Admitting: Internal Medicine

## 2020-06-04 ENCOUNTER — Encounter: Payer: Self-pay | Admitting: Internal Medicine

## 2020-06-04 VITALS — BP 124/78 | HR 77 | Temp 98.0°F | Ht 68.0 in | Wt 189.0 lb

## 2020-06-04 DIAGNOSIS — I10 Essential (primary) hypertension: Secondary | ICD-10-CM

## 2020-06-04 DIAGNOSIS — E1169 Type 2 diabetes mellitus with other specified complication: Secondary | ICD-10-CM | POA: Diagnosis not present

## 2020-06-04 DIAGNOSIS — E118 Type 2 diabetes mellitus with unspecified complications: Secondary | ICD-10-CM

## 2020-06-04 DIAGNOSIS — E785 Hyperlipidemia, unspecified: Secondary | ICD-10-CM

## 2020-06-04 DIAGNOSIS — R011 Cardiac murmur, unspecified: Secondary | ICD-10-CM | POA: Diagnosis not present

## 2020-06-04 MED ORDER — LISINOPRIL 20 MG PO TABS: 20.0000 mg | ORAL_TABLET | Freq: Every day | ORAL | 3 refills | Status: DC

## 2020-06-04 MED ORDER — ATORVASTATIN CALCIUM 10 MG PO TABS: 10.0000 mg | ORAL_TABLET | Freq: Every day | ORAL | 3 refills | Status: DC

## 2020-06-04 NOTE — Progress Notes (Signed)
Date:  06/04/2020   Name:  Alicia Richard   DOB:  1975-11-06   MRN:  [id number]   Chief Complaint: Diabetes (BS this A.M 140 ) and Hypertension  Diabetes She presents for her follow-up diabetic visit. She has type 2 diabetes mellitus. Pertinent negatives for hypoglycemia include no dizziness, headaches or tremors. Pertinent negatives for diabetes include no chest pain, no fatigue, no polydipsia, no polyuria and no weakness. Symptoms are stable. Pertinent negatives for diabetic complications include no CVA. Current diabetic treatment includes oral agent (triple therapy) Celesta Gentile, metformin, farxiga). She is compliant with treatment all of the time. Her weight is decreasing steadily. She is following a generally healthy diet. She participates in exercise three times a week. Her home blood glucose trend is decreasing steadily. Her breakfast blood glucose is taken between 6-7 am. Her breakfast blood glucose range is generally 110-130 mg/dl. An ACE inhibitor/angiotensin II receptor blocker is being taken. Eye exam is current.  Hypertension This is a chronic problem. The problem is controlled. Pertinent negatives include no chest pain, headaches, palpitations or shortness of breath. Past treatments include ACE inhibitors. There is no history of kidney disease, CAD/MI or CVA.  Hyperlipidemia This is a chronic problem. The problem is controlled. Pertinent negatives include no chest pain or shortness of breath. Current antihyperlipidemic treatment includes statins. The current treatment provides significant improvement of lipids.    Lab Results  Component Value Date   CREATININE 0.63 07/03/2019   BUN 6 07/03/2019   NA 141 07/03/2019   K 4.5 07/03/2019   CL 102 07/03/2019   CO2 24 07/03/2019   Lab Results  Component Value Date   CHOL 122 07/03/2019   HDL 39 (L) 07/03/2019   LDLCALC 58 07/03/2019   TRIG 141 07/03/2019   CHOLHDL 3.1 07/03/2019   Lab Results  Component Value Date   TSH 2.230  07/03/2019   Lab Results  Component Value Date   HGBA1C 8.4 (H) 11/14/2019   Lab Results  Component Value Date   WBC 8.2 10/26/2018   HGB 11.7 10/26/2018   HCT 37.3 10/26/2018   MCV 74 (L) 10/26/2018   PLT 320 10/26/2018   Lab Results  Component Value Date   ALT 19 07/03/2019   AST 18 07/03/2019   ALKPHOS 118 (H) 07/03/2019   BILITOT 0.6 07/03/2019     Review of Systems  Constitutional: Negative for appetite change, fatigue, fever and unexpected weight change.  HENT: Negative for nosebleeds, tinnitus and trouble swallowing.   Eyes: Negative for visual disturbance.  Respiratory: Negative for cough, chest tightness, shortness of breath and wheezing.   Cardiovascular: Negative for chest pain, palpitations and leg swelling.  Gastrointestinal: Negative for abdominal pain, constipation and diarrhea.  Endocrine: Negative for polydipsia and polyuria.  Genitourinary: Negative for dysuria and hematuria.  Musculoskeletal: Negative for arthralgias.  Neurological: Negative for dizziness, tremors, weakness, light-headedness, numbness and headaches.  Psychiatric/Behavioral: Negative for dysphoric mood.    Patient Active Problem List   Diagnosis Date Noted  . Hyperlipidemia associated with type 2 diabetes mellitus ([place]) 10/11/2014  . Essential (primary) hypertension 08/14/2014  . Type II diabetes mellitus with complication ([place]) 12/45/8099    No Known Allergies  Past Surgical History:  Procedure Laterality Date  . BREAST BIOPSY Right 10/10/2015   Stereotactic biopsy - benign  . WISDOM TOOTH EXTRACTION      Social History   Tobacco Use  . Smoking status: Never Smoker  . Smokeless tobacco: Never Used  Vaping Use  .  Vaping Use: Never used  Substance Use Topics  . Alcohol use: No    Alcohol/week: 0.0 standard drinks  . Drug use: No     Medication list has been reviewed and updated.  Current Meds  Medication Sig  . atorvastatin (LIPITOR) 10 MG tablet Take 1 tablet  (10 mg total) by mouth daily at 6 PM.  . dapagliflozin propanediol (FARXIGA) 10 MG TABS tablet Take 1 tablet (10 mg total) by mouth daily before breakfast.  . FREESTYLE LITE test strip U UTD ONCE D  . lisinopril (ZESTRIL) 20 MG tablet Take 1 tablet (20 mg total) by mouth daily.  . metFORMIN (GLUCOPHAGE-XR) 500 MG 24 hr tablet Take 2 tablets (1,000 mg total) by mouth 2 (two) times daily. (Patient taking differently: Take 1,000 mg by mouth daily.)  . nystatin-triamcinolone ointment (MYCOLOG) Apply topically 2 (two) times daily.  . sitaGLIPtin (JANUVIA) 100 MG tablet Take 1 tablet (100 mg total) by mouth daily.    PHQ 2/9 Scores 06/04/2020 03/07/2020 07/03/2019 03/15/2019  PHQ - 2 Score 0 0 0 0  PHQ- 9 Score 0 0 0 -    GAD 7 : Generalized Anxiety Score 06/04/2020 03/07/2020 07/03/2019  Nervous, Anxious, on Edge 0 0 1  Control/stop worrying 0 0 0  Worry too much - different things 0 0 0  Trouble relaxing 0 0 0  Restless 0 0 0  Easily annoyed or irritable 0 0 0  Afraid - awful might happen 0 0 0  Total GAD 7 Score 0 0 1  Anxiety Difficulty - - Not difficult at all    BP Readings from Last 3 Encounters:  06/04/20 124/78  03/07/20 126/84  11/13/19 112/64    Physical Exam Vitals and nursing note reviewed.  Constitutional:      General: She is not in acute distress.    Appearance: Normal appearance. She is well-developed.  HENT:     Head: Normocephalic and atraumatic.  Neck:     Vascular: No carotid bruit.  Cardiovascular:     Rate and Rhythm: Normal rate and regular rhythm.  No extrasystoles are present.    Heart sounds: Murmur heard.      Comments: Faint systolic click Pulmonary:     Effort: Pulmonary effort is normal. No respiratory distress.     Breath sounds: Normal breath sounds. No wheezing or rhonchi.  Musculoskeletal:     Cervical back: Normal range of motion.  Lymphadenopathy:     Cervical: No cervical adenopathy.  Skin:    General: Skin is warm and dry.     Findings:  No rash.  Neurological:     Mental Status: She is alert and oriented to person, place, and time.  Psychiatric:        Attention and Perception: Attention normal.        Mood and Affect: Mood normal.        Behavior: Behavior normal.     Wt Readings from Last 3 Encounters:  06/04/20 189 lb (85.7 kg)  03/07/20 203 lb (92.1 kg)  11/13/19 203 lb (92.1 kg)    BP 124/78   Pulse 77   Temp 98 F (36.7 C) (Oral)   Ht 5\' 8"  (1.727 m)   Wt 189 lb (85.7 kg)   SpO2 97%   BMI 28.74 kg/m   Assessment and Plan: 1. Type II diabetes mellitus with complication (HCC) Clinically stable by exam and report without s/s of hypoglycemia.  Much improved with addition of [place]. DM complicated  by HTN. Tolerating medications well without side effects or other concerns. Has lost 14 lbs. With diet change and exercise - Comprehensive metabolic panel - Hemoglobin A1c  2. Essential (primary) hypertension Clinically stable exam with well controlled BP. Tolerating medications without side effects at this time. Pt to continue current regimen and low sodium diet; benefits of regular exercise as able discussed. - CBC with Differential/Platelet - lisinopril (ZESTRIL) 20 MG tablet; Take 1 tablet (20 mg total) by mouth daily.  Dispense: 90 tablet; Refill: 3  3. Hyperlipidemia associated with type 2 diabetes mellitus ([place]) On high intensity statin therapy without side effects. - Lipid panel - atorvastatin (LIPITOR) 10 MG tablet; Take 1 tablet (10 mg total) by mouth daily at 6 PM.  Dispense: 90 tablet; Refill: 3   Partially dictated using Editor, commissioning. Any errors are unintentional.  [name], MD [facility]  06/04/2020

## 2020-06-05 LAB — CBC WITH DIFFERENTIAL/PLATELET
Basophils Absolute: 0.1 10*3/uL (ref 0.0–0.2)
Basos: 1 %
EOS (ABSOLUTE): 0.1 10*3/uL (ref 0.0–0.4)
Eos: 2 %
Hematocrit: 36.3 % (ref 34.0–46.6)
Hemoglobin: 10.6 g/dL — ABNORMAL LOW (ref 11.1–15.9)
Immature Grans (Abs): 0 10*3/uL (ref 0.0–0.1)
Immature Granulocytes: 0 %
Lymphocytes Absolute: 2.1 10*3/uL (ref 0.7–3.1)
Lymphs: 30 %
MCH: 21 pg — ABNORMAL LOW (ref 26.6–33.0)
MCHC: 29.2 g/dL — ABNORMAL LOW (ref 31.5–35.7)
MCV: 72 fL — ABNORMAL LOW (ref 79–97)
Monocytes Absolute: 0.5 10*3/uL (ref 0.1–0.9)
Monocytes: 8 %
Neutrophils Absolute: 4.1 10*3/uL (ref 1.4–7.0)
Neutrophils: 59 %
Platelets: 306 10*3/uL (ref 150–450)
RBC: 5.05 x10E6/uL (ref 3.77–5.28)
RDW: 15.8 % — ABNORMAL HIGH (ref 11.7–15.4)
WBC: 6.8 10*3/uL (ref 3.4–10.8)

## 2020-06-05 LAB — LIPID PANEL
Chol/HDL Ratio: 3.2 ratio (ref 0.0–4.4)
Cholesterol, Total: 133 mg/dL (ref 100–199)
HDL: 41 mg/dL (ref >39)
LDL Chol Calc (NIH): 70 mg/dL (ref 0–99)
Triglycerides: 122 mg/dL (ref 0–149)
VLDL Cholesterol Cal: 22 mg/dL (ref 5–40)

## 2020-06-05 LAB — COMPREHENSIVE METABOLIC PANEL
ALT: 21 IU/L (ref 0–32)
AST: 17 IU/L (ref 0–40)
Albumin/Globulin Ratio: 2.2 (ref 1.2–2.2)
Albumin: 4.8 g/dL (ref 3.8–4.8)
Alkaline Phosphatase: 94 IU/L (ref 44–121)
BUN/Creatinine Ratio: 14 (ref 9–23)
BUN: 9 mg/dL (ref 6–24)
Bilirubin Total: 0.6 mg/dL (ref 0.0–1.2)
CO2: 22 mmol/L (ref 20–29)
Calcium: 9.8 mg/dL (ref 8.7–10.2)
Chloride: 100 mmol/L (ref 96–106)
Creatinine, Ser: 0.63 mg/dL (ref 0.57–1.00)
Globulin, Total: 2.2 g/dL (ref 1.5–4.5)
Glucose: 122 mg/dL — ABNORMAL HIGH (ref 65–99)
Potassium: 4.4 mmol/L (ref 3.5–5.2)
Sodium: 138 mmol/L (ref 134–144)
Total Protein: 7 g/dL (ref 6.0–8.5)
eGFR: 111 mL/min/{1.73_m2} (ref >59)

## 2020-06-05 LAB — HEMOGLOBIN A1C
Est. average glucose Bld gHb Est-mCnc: 166 mg/dL
Hgb A1c MFr Bld: 7.4 % — ABNORMAL HIGH (ref 4.8–5.6)

## 2020-06-16 LAB — IRON AND TIBC
Iron Saturation: 10 % — ABNORMAL LOW (ref 15–55)
Iron: 43 ug/dL (ref 27–159)
Total Iron Binding Capacity: 426 ug/dL (ref 250–450)
UIBC: 383 ug/dL (ref 131–425)

## 2020-06-16 LAB — FERRITIN: Ferritin: 7 ng/mL — ABNORMAL LOW (ref 15–150)

## 2020-06-16 LAB — SPECIMEN STATUS REPORT

## 2020-07-24 ENCOUNTER — Other Ambulatory Visit: Payer: Self-pay | Admitting: Internal Medicine

## 2020-07-24 DIAGNOSIS — E785 Hyperlipidemia, unspecified: Secondary | ICD-10-CM

## 2020-07-24 DIAGNOSIS — I10 Essential (primary) hypertension: Secondary | ICD-10-CM

## 2020-07-24 DIAGNOSIS — E1169 Type 2 diabetes mellitus with other specified complication: Secondary | ICD-10-CM

## 2020-08-06 ENCOUNTER — Other Ambulatory Visit: Payer: Self-pay | Admitting: Internal Medicine

## 2020-08-06 DIAGNOSIS — I10 Essential (primary) hypertension: Secondary | ICD-10-CM

## 2020-08-06 DIAGNOSIS — E785 Hyperlipidemia, unspecified: Secondary | ICD-10-CM

## 2020-08-06 DIAGNOSIS — E118 Type 2 diabetes mellitus with unspecified complications: Secondary | ICD-10-CM

## 2020-08-06 MED ORDER — ATORVASTATIN CALCIUM 10 MG PO TABS: 10.0000 mg | ORAL_TABLET | Freq: Every day | ORAL | 0 refills | Status: DC

## 2020-08-06 MED ORDER — LISINOPRIL 20 MG PO TABS: 20.0000 mg | ORAL_TABLET | Freq: Every day | ORAL | 0 refills | Status: DC

## 2020-08-06 MED ORDER — METFORMIN HCL ER 500 MG PO TB24: 1000.0000 mg | ORAL_TABLET | Freq: Every day | ORAL | 0 refills | Status: DC

## 2020-08-06 NOTE — Telephone Encounter (Signed)
Medication: lisinopril (ZESTRIL) 20 MG tablet [[id number]] , atorvastatin (LIPITOR) 10 MG tablet [[id number]], metFORMIN (GLUCOPHAGE-XR) 500 MG 24 hr tablet [[id number]]   Apt scheduled 10/10/20 Has the patient contacted their pharmacy? YES  (Agent: If no, request that the patient contact the pharmacy for the refill.) (Agent: If yes, when and what did the pharmacy advise?)  Preferred Pharmacy (with phone number or street name): [facility] [place], [address] [place] [address] Phone: [phone] Fax: [phone] Hours: Not open 24 hours    Agent: Please be advised that RX refills may take up to 3 business days. We ask that you follow-up with your pharmacy.

## 2020-08-08 ENCOUNTER — Other Ambulatory Visit: Payer: Self-pay

## 2020-08-08 DIAGNOSIS — D649 Anemia, unspecified: Secondary | ICD-10-CM

## 2020-08-08 NOTE — Progress Notes (Signed)
Patient walked into office to have CBC, and Iron studies rechecked. Printed labs, handed to patient, and told her where to go to [place] for blood draw.

## 2020-08-09 LAB — CBC WITH DIFFERENTIAL/PLATELET
Basophils Absolute: 0.1 10*3/uL (ref 0.0–0.2)
Basos: 1 %
EOS (ABSOLUTE): 0.1 10*3/uL (ref 0.0–0.4)
Eos: 2 %
Hematocrit: 37.5 % (ref 34.0–46.6)
Hemoglobin: 11.9 g/dL (ref 11.1–15.9)
Immature Grans (Abs): 0 10*3/uL (ref 0.0–0.1)
Immature Granulocytes: 0 %
Lymphocytes Absolute: 2.1 10*3/uL (ref 0.7–3.1)
Lymphs: 31 %
MCH: 23 pg — ABNORMAL LOW (ref 26.6–33.0)
MCHC: 31.7 g/dL (ref 31.5–35.7)
MCV: 72 fL — ABNORMAL LOW (ref 79–97)
Monocytes Absolute: 0.5 10*3/uL (ref 0.1–0.9)
Monocytes: 8 %
Neutrophils Absolute: 4 10*3/uL (ref 1.4–7.0)
Neutrophils: 58 %
Platelets: 284 10*3/uL (ref 150–450)
RBC: 5.18 x10E6/uL (ref 3.77–5.28)
RDW: 17.5 % — ABNORMAL HIGH (ref 11.7–15.4)
WBC: 6.8 10*3/uL (ref 3.4–10.8)

## 2020-08-09 LAB — IRON,TIBC AND FERRITIN PANEL
Ferritin: 15 ng/mL (ref 15–150)
Iron Saturation: 8 % — CL (ref 15–55)
Iron: 29 ug/dL (ref 27–159)
Total Iron Binding Capacity: 374 ug/dL (ref 250–450)
UIBC: 345 ug/dL (ref 131–425)

## 2020-10-10 ENCOUNTER — Other Ambulatory Visit: Payer: Self-pay

## 2020-10-10 ENCOUNTER — Encounter: Payer: Self-pay | Admitting: Internal Medicine

## 2020-10-10 ENCOUNTER — Ambulatory Visit (INDEPENDENT_AMBULATORY_CARE_PROVIDER_SITE_OTHER): Payer: PRIVATE HEALTH INSURANCE | Admitting: Internal Medicine

## 2020-10-10 VITALS — BP 104/72 | HR 64 | Temp 97.8°F | Ht 68.0 in | Wt 189.0 lb

## 2020-10-10 DIAGNOSIS — D509 Iron deficiency anemia, unspecified: Secondary | ICD-10-CM | POA: Insufficient documentation

## 2020-10-10 DIAGNOSIS — E1169 Type 2 diabetes mellitus with other specified complication: Secondary | ICD-10-CM | POA: Diagnosis not present

## 2020-10-10 DIAGNOSIS — I10 Essential (primary) hypertension: Secondary | ICD-10-CM

## 2020-10-10 DIAGNOSIS — E118 Type 2 diabetes mellitus with unspecified complications: Secondary | ICD-10-CM

## 2020-10-10 DIAGNOSIS — E785 Hyperlipidemia, unspecified: Secondary | ICD-10-CM

## 2020-10-10 LAB — POCT GLYCOSYLATED HEMOGLOBIN (HGB A1C): Hemoglobin A1C: 7 % — AB (ref 4.0–5.6)

## 2020-10-10 MED ORDER — ATORVASTATIN CALCIUM 10 MG PO TABS: 10.0000 mg | ORAL_TABLET | Freq: Every day | ORAL | 3 refills | Status: DC

## 2020-10-10 MED ORDER — METFORMIN HCL ER 500 MG PO TB24: 1000.0000 mg | ORAL_TABLET | Freq: Every day | ORAL | 1 refills | Status: DC

## 2020-10-10 MED ORDER — DAPAGLIFLOZIN PROPANEDIOL 10 MG PO TABS: 10.0000 mg | ORAL_TABLET | Freq: Every day | ORAL | 1 refills | Status: DC

## 2020-10-10 MED ORDER — LISINOPRIL 20 MG PO TABS: 20.0000 mg | ORAL_TABLET | Freq: Every day | ORAL | 3 refills | Status: DC

## 2020-10-10 NOTE — Progress Notes (Signed)
Date:  10/10/2020   Name:  Alicia Richard   DOB:  16-Mar-1975   MRN:  [id number]   Chief Complaint: Diabetes (Last BS 166 this morning )  Diabetes She presents for her follow-up diabetic visit. She has type 2 diabetes mellitus. Her disease course has been stable. Pertinent negatives for hypoglycemia include no headaches or tremors. Pertinent negatives for diabetes include no chest pain, no fatigue, no polydipsia and no polyuria. Current diabetic treatment includes oral agent (triple therapy) Celesta Gentile, metformin, farxiga). She is compliant with treatment all of the time. She is following a diabetic diet. She participates in exercise three times a week (cut back during the summer but starting back now with school resuming). An ACE inhibitor/angiotensin II receptor blocker is being taken. Eye exam is current.  Anemia Presents for follow-up (feels better with more energy - continues on daily iron) visit. There has been no abdominal pain, fever, light-headedness, malaise/fatigue or palpitations. There are no compliance problems.    Lab Results  Component Value Date   CREATININE 0.63 06/04/2020   BUN 9 06/04/2020   NA 138 06/04/2020   K 4.4 06/04/2020   CL 100 06/04/2020   CO2 22 06/04/2020   Lab Results  Component Value Date   CHOL 133 06/04/2020   HDL 41 06/04/2020   LDLCALC 70 06/04/2020   TRIG 122 06/04/2020   CHOLHDL 3.2 06/04/2020   Lab Results  Component Value Date   TSH 2.230 07/03/2019   Lab Results  Component Value Date   HGBA1C 7.4 (H) 06/04/2020   Lab Results  Component Value Date   WBC 6.8 08/08/2020   HGB 11.9 08/08/2020   HCT 37.5 08/08/2020   MCV 72 (L) 08/08/2020   PLT 284 08/08/2020   Lab Results  Component Value Date   ALT 21 06/04/2020   AST 17 06/04/2020   ALKPHOS 94 06/04/2020   BILITOT 0.6 06/04/2020     Review of Systems  Constitutional:  Negative for appetite change, fatigue, fever, malaise/fatigue and unexpected weight change.  HENT:   Negative for tinnitus and trouble swallowing.   Eyes:  Negative for visual disturbance.  Respiratory:  Negative for cough, chest tightness and shortness of breath.   Cardiovascular:  Negative for chest pain, palpitations and leg swelling.  Gastrointestinal:  Negative for abdominal pain.  Endocrine: Negative for polydipsia and polyuria.  Genitourinary:  Negative for dysuria and hematuria.  Musculoskeletal:  Negative for arthralgias.  Neurological:  Negative for tremors, light-headedness, numbness and headaches.  Psychiatric/Behavioral:  Negative for dysphoric mood.    Patient Active Problem List   Diagnosis Date Noted   Iron deficiency anemia 10/10/2020   Heart murmur 06/04/2020   Hyperlipidemia associated with type 2 diabetes mellitus ([place]) 10/11/2014   Essential (primary) hypertension 08/14/2014   Type II diabetes mellitus with complication (HCC) [id number]    No Known Allergies  Past Surgical History:  Procedure Laterality Date   BREAST BIOPSY Right 10/10/2015   Stereotactic biopsy - benign   WISDOM TOOTH EXTRACTION      Social History   Tobacco Use   Smoking status: Never   Smokeless tobacco: Never  Vaping Use   Vaping Use: Never used  Substance Use Topics   Alcohol use: No    Alcohol/week: 0.0 standard drinks   Drug use: No     Medication list has been reviewed and updated.  Current Meds  Medication Sig   atorvastatin (LIPITOR) 10 MG tablet Take 1 tablet (10 mg total) by  mouth daily at 6 PM.   dapagliflozin propanediol (FARXIGA) 10 MG TABS tablet Take 1 tablet (10 mg total) by mouth daily before breakfast.   FREESTYLE LITE test strip U UTD ONCE D   lisinopril (ZESTRIL) 20 MG tablet Take 1 tablet (20 mg total) by mouth daily.   metFORMIN (GLUCOPHAGE-XR) 500 MG 24 hr tablet Take 2 tablets (1,000 mg total) by mouth daily.   nystatin-triamcinolone ointment (MYCOLOG) Apply topically 2 (two) times daily.   sitaGLIPtin (JANUVIA) 100 MG tablet Take 1 tablet (100 mg  total) by mouth daily.    PHQ 2/9 Scores 10/10/2020 06/04/2020 03/07/2020 07/03/2019  PHQ - 2 Score 0 0 0 0  PHQ- 9 Score 1 0 0 0    GAD 7 : Generalized Anxiety Score 10/10/2020 06/04/2020 03/07/2020 07/03/2019  Nervous, Anxious, on Edge 0 0 0 1  Control/stop worrying 0 0 0 0  Worry too much - different things 0 0 0 0  Trouble relaxing 0 0 0 0  Restless 0 0 0 0  Easily annoyed or irritable 0 0 0 0  Afraid - awful might happen 0 0 0 0  Total GAD 7 Score 0 0 0 1  Anxiety Difficulty - - - Not difficult at all    BP Readings from Last 3 Encounters:  10/10/20 104/72  06/04/20 124/78  03/07/20 126/84    Physical Exam Vitals and nursing note reviewed.  Constitutional:      General: She is not in acute distress.    Appearance: She is well-developed.  HENT:     Head: Normocephalic and atraumatic.  Neck:     Vascular: No carotid bruit.  Cardiovascular:     Rate and Rhythm: Normal rate and regular rhythm.     Pulses: Normal pulses.  Pulmonary:     Effort: Pulmonary effort is normal. No respiratory distress.     Breath sounds: No wheezing or rhonchi.  Musculoskeletal:     Right lower leg: No edema.     Left lower leg: No edema.  Lymphadenopathy:     Cervical: No cervical adenopathy.  Skin:    General: Skin is warm and dry.     Findings: No rash.  Neurological:     General: No focal deficit present.     Mental Status: She is alert and oriented to person, place, and time.  Psychiatric:        Mood and Affect: Mood normal.        Behavior: Behavior normal.    Wt Readings from Last 3 Encounters:  10/10/20 189 lb (85.7 kg)  06/04/20 189 lb (85.7 kg)  03/07/20 203 lb (92.1 kg)    BP 104/72   Pulse 64   Temp 97.8 F (36.6 C) (Oral)   Ht '5\' 8"'$  (1.727 m)   Wt 189 lb (85.7 kg)   LMP 09/28/2020   SpO2 98%   BMI 28.74 kg/m   Assessment and Plan: 1. Type II diabetes mellitus with complication (HCC) Clinically stable by exam and report without s/s of hypoglycemia. DM  complicated by hypertension and dyslipidemia. Tolerating medications well without side effects or other concerns. - POCT HgB A1C = 7.0 down from 7.4 - dapagliflozin propanediol (FARXIGA) 10 MG TABS tablet; Take 1 tablet (10 mg total) by mouth daily before breakfast.  Dispense: 90 tablet; Refill: 1 - metFORMIN (GLUCOPHAGE-XR) 500 MG 24 hr tablet; Take 2 tablets (1,000 mg total) by mouth daily.  Dispense: 180 tablet; Refill: 1  2. Essential (primary) hypertension  Clinically stable exam with well controlled BP. Tolerating medications without side effects at this time. Pt to continue current regimen and low sodium diet; benefits of regular exercise as able discussed. - lisinopril (ZESTRIL) 20 MG tablet; Take 1 tablet (20 mg total) by mouth daily.  Dispense: 90 tablet; Refill: 3  3. Iron deficiency anemia, unspecified iron deficiency anemia type Continues on iron daily. Last CBC and ferritin levels improved No evidence of bleeding  4. Hyperlipidemia associated with type 2 diabetes mellitus (HCC) - atorvastatin (LIPITOR) 10 MG tablet; Take 1 tablet (10 mg total) by mouth daily at 6 PM.  Dispense: 90 tablet; Refill: 3   Partially dictated using Editor, commissioning. Any errors are unintentional.  [name], MD [facility]  10/10/2020

## 2020-11-28 ENCOUNTER — Other Ambulatory Visit: Payer: Self-pay | Admitting: Obstetrics and Gynecology

## 2020-11-28 DIAGNOSIS — Z1231 Encounter for screening mammogram for malignant neoplasm of breast: Secondary | ICD-10-CM

## 2020-12-29 ENCOUNTER — Ambulatory Visit
Admission: RE | Admit: 2020-12-29 | Discharge: 2020-12-29 | Disposition: A | Discharge status: Home / Self Care | Payer: PRIVATE HEALTH INSURANCE | Source: Ambulatory Visit | Attending: Obstetrics and Gynecology | Admitting: Obstetrics and Gynecology

## 2020-12-29 ENCOUNTER — Other Ambulatory Visit: Payer: Self-pay

## 2020-12-29 DIAGNOSIS — Z1231 Encounter for screening mammogram for malignant neoplasm of breast: Secondary | ICD-10-CM | POA: Diagnosis present

## 2021-02-10 ENCOUNTER — Ambulatory Visit: Payer: PRIVATE HEALTH INSURANCE | Admitting: Internal Medicine

## 2021-02-17 ENCOUNTER — Encounter: Payer: Self-pay | Admitting: Internal Medicine

## 2021-02-17 ENCOUNTER — Other Ambulatory Visit: Payer: Self-pay | Admitting: Internal Medicine

## 2021-02-17 ENCOUNTER — Ambulatory Visit (INDEPENDENT_AMBULATORY_CARE_PROVIDER_SITE_OTHER): Payer: PRIVATE HEALTH INSURANCE | Admitting: Internal Medicine

## 2021-02-17 ENCOUNTER — Other Ambulatory Visit: Payer: Self-pay

## 2021-02-17 VITALS — BP 106/82 | HR 72 | Ht 68.0 in | Wt 192.0 lb

## 2021-02-17 DIAGNOSIS — E118 Type 2 diabetes mellitus with unspecified complications: Secondary | ICD-10-CM

## 2021-02-17 DIAGNOSIS — I10 Essential (primary) hypertension: Secondary | ICD-10-CM | POA: Diagnosis not present

## 2021-02-17 LAB — POCT GLYCOSYLATED HEMOGLOBIN (HGB A1C): Hemoglobin A1C: 7 % — AB (ref 4.0–5.6)

## 2021-02-17 MED ORDER — SITAGLIPTIN PHOSPHATE 100 MG PO TABS: 100.0000 mg | ORAL_TABLET | Freq: Every day | ORAL | 3 refills | Status: DC

## 2021-02-17 NOTE — Progress Notes (Signed)
Date:  02/17/2021   Name:  Alicia Richard   DOB:  August 19, 1975   MRN:  [id number]   Chief Complaint: Hypertension and Diabetes (Last BS 152 this AM)  Diabetes She presents for her follow-up diabetic visit. She has type 2 diabetes mellitus. Pertinent negatives for hypoglycemia include no headaches or tremors. Pertinent negatives for diabetes include no chest pain, no fatigue, no polydipsia and no polyuria. Current diabetic treatment includes oral agent (triple therapy) (metformin and [place] and farxiga). She is compliant with treatment all of the time. An ACE inhibitor/angiotensin II receptor blocker is being taken. Eye exam is current.   Lab Results  Component Value Date   NA 138 06/04/2020   K 4.4 06/04/2020   CO2 22 06/04/2020   GLUCOSE 122 (H) 06/04/2020   BUN 9 06/04/2020   CREATININE 0.63 06/04/2020   CALCIUM 9.8 06/04/2020   EGFR 111 06/04/2020   GFRNONAA 109 07/03/2019   Lab Results  Component Value Date   CHOL 133 06/04/2020   HDL 41 06/04/2020   LDLCALC 70 06/04/2020   TRIG 122 06/04/2020   CHOLHDL 3.2 06/04/2020   Lab Results  Component Value Date   TSH 2.230 07/03/2019   Lab Results  Component Value Date   HGBA1C 7.0 (A) 02/17/2021   Lab Results  Component Value Date   WBC 6.8 08/08/2020   HGB 11.9 08/08/2020   HCT 37.5 08/08/2020   MCV 72 (L) 08/08/2020   PLT 284 08/08/2020   Lab Results  Component Value Date   ALT 21 06/04/2020   AST 17 06/04/2020   ALKPHOS 94 06/04/2020   BILITOT 0.6 06/04/2020   No results found for: 25OHVITD2, 25OHVITD3, VD25OH   Review of Systems  Constitutional:  Negative for appetite change, fatigue, fever and unexpected weight change.  HENT:  Negative for tinnitus and trouble swallowing.   Eyes:  Negative for visual disturbance.  Respiratory:  Negative for cough, chest tightness and shortness of breath.   Cardiovascular:  Negative for chest pain, palpitations and leg swelling.  Gastrointestinal:  Negative for abdominal  pain.  Endocrine: Negative for polydipsia and polyuria.  Genitourinary:  Negative for dysuria and hematuria.  Musculoskeletal:  Negative for arthralgias.  Neurological:  Negative for tremors, numbness and headaches.  Psychiatric/Behavioral:  Negative for dysphoric mood.    Patient Active Problem List   Diagnosis Date Noted   Iron deficiency anemia 10/10/2020   Heart murmur 06/04/2020   Hyperlipidemia associated with type 2 diabetes mellitus ([place]) 10/11/2014   Essential (primary) hypertension 08/14/2014   Type II diabetes mellitus with complication (HCC) 83/38/2505    No Known Allergies  Past Surgical History:  Procedure Laterality Date   BREAST BIOPSY Right 10/10/2015   Stereotactic biopsy - benign   WISDOM TOOTH EXTRACTION      Social History   Tobacco Use   Smoking status: Never   Smokeless tobacco: Never  Vaping Use   Vaping Use: Never used  Substance Use Topics   Alcohol use: No    Alcohol/week: 0.0 standard drinks   Drug use: No     Medication list has been reviewed and updated.  Current Meds  Medication Sig   atorvastatin (LIPITOR) 10 MG tablet Take 1 tablet (10 mg total) by mouth daily at 6 PM.   dapagliflozin propanediol (FARXIGA) 10 MG TABS tablet Take 1 tablet (10 mg total) by mouth daily before breakfast.   FREESTYLE LITE test strip U UTD ONCE D   lisinopril (ZESTRIL) 20 MG  tablet Take 1 tablet (20 mg total) by mouth daily.   metFORMIN (GLUCOPHAGE-XR) 500 MG 24 hr tablet Take 2 tablets (1,000 mg total) by mouth daily.   nystatin-triamcinolone ointment (MYCOLOG) Apply topically 2 (two) times daily.   [DISCONTINUED] sitaGLIPtin (JANUVIA) 100 MG tablet Take 1 tablet (100 mg total) by mouth daily.    PHQ 2/9 Scores 02/17/2021 10/10/2020 06/04/2020 03/07/2020  PHQ - 2 Score 0 0 0 0  PHQ- 9 Score 0 1 0 0    GAD 7 : Generalized Anxiety Score 02/17/2021 10/10/2020 06/04/2020 03/07/2020  Nervous, Anxious, on Edge 1 0 0 0  Control/stop worrying 0 0 0 0  Worry  too much - different things 0 0 0 0  Trouble relaxing 0 0 0 0  Restless 0 0 0 0  Easily annoyed or irritable 0 0 0 0  Afraid - awful might happen 0 0 0 0  Total GAD 7 Score 1 0 0 0  Anxiety Difficulty - - - -    BP Readings from Last 3 Encounters:  02/17/21 106/82  10/10/20 104/72  06/04/20 124/78    Physical Exam Vitals and nursing note reviewed.  Constitutional:      General: She is not in acute distress.    Appearance: She is well-developed.  HENT:     Head: Normocephalic and atraumatic.  Neck:     Vascular: No carotid bruit.  Cardiovascular:     Rate and Rhythm: Normal rate and regular rhythm.  Pulmonary:     Effort: Pulmonary effort is normal. No respiratory distress.     Breath sounds: No wheezing or rhonchi.  Musculoskeletal:     Cervical back: Normal range of motion.     Right lower leg: No edema.     Left lower leg: No edema.  Lymphadenopathy:     Cervical: No cervical adenopathy.  Skin:    General: Skin is warm and dry.     Capillary Refill: Capillary refill takes less than 2 seconds.     Findings: No rash.  Neurological:     General: No focal deficit present.     Mental Status: She is alert and oriented to person, place, and time.  Psychiatric:        Mood and Affect: Mood normal.        Behavior: Behavior normal.    Wt Readings from Last 3 Encounters:  02/17/21 192 lb (87.1 kg)  10/10/20 189 lb (85.7 kg)  06/04/20 189 lb (85.7 kg)    BP 106/82    Pulse 72    Ht '5\' 8"'  (1.727 m)    Wt 192 lb (87.1 kg)    SpO2 97%    BMI 29.19 kg/m   Assessment and Plan: 1. Type II diabetes mellitus with complication (HCC) Clinically stable by exam and report without s/s of hypoglycemia. DM complicated by hypertension and dyslipidemia. Tolerating medications well without side effects or other concerns.  Well controlled on triple therapy. - POCT glycosylated hemoglobin (Hb A1C) = 7.0 (stable) - Microalbumin / creatinine urine ratio - sitaGLIPtin (JANUVIA) 100 MG  tablet; Take 1 tablet (100 mg total) by mouth daily.  Dispense: 90 tablet; Refill: 3  2. Essential (primary) hypertension Clinically stable exam with well controlled BP. Tolerating medications without side effects at this time. Pt to continue current regimen and low sodium diet; benefits of regular exercise as able discussed.   Partially dictated using Editor, commissioning. Any errors are unintentional.  [name], MD [facility]  [facility]  02/17/2021

## 2021-02-18 LAB — MICROALBUMIN / CREATININE URINE RATIO
Creatinine, Urine: 74.7 mg/dL
Microalb/Creat Ratio: 6 mg/g creat (ref 0–29)
Microalbumin, Urine: 4.4 ug/mL

## 2021-04-27 ENCOUNTER — Other Ambulatory Visit: Payer: Self-pay | Admitting: Internal Medicine

## 2021-04-27 DIAGNOSIS — E118 Type 2 diabetes mellitus with unspecified complications: Secondary | ICD-10-CM

## 2021-04-28 NOTE — Telephone Encounter (Signed)
Requested Prescriptions  ?Pending Prescriptions Disp Refills  ?? FARXIGA 10 MG TABS tablet [Pharmacy Med Name: FARXIGA 10MG TABLETS] 90 tablet 0  ?  Sig: TAKE 1 TABLET(10 MG) BY MOUTH DAILY BEFORE BREAKFAST  ?  ? Endocrinology:  Diabetes - SGLT2 Inhibitors Passed - 04/27/2021 12:13 PM  ?  ?  Passed - Cr in normal range and within 360 days  ?  Creatinine, Ser  ?Date Value Ref Range Status  ?06/04/2020 0.63 0.57 - 1.00 mg/dL Final  ?   ?  ?  Passed - HBA1C is between 0 and 7.9 and within 180 days  ?  Hemoglobin A1C  ?Date Value Ref Range Status  ?02/17/2021 7.0 (A) 4.0 - 5.6 % Final  ? ?Hgb A1c MFr Bld  ?Date Value Ref Range Status  ?06/04/2020 7.4 (H) 4.8 - 5.6 % Final  ?  Comment:  ?           Prediabetes: 5.7 - 6.4 ?         Diabetes: >6.4 ?         Glycemic control for adults with diabetes: <7.0 ?  ?   ?  ?  Passed - eGFR in normal range and within 360 days  ?  GFR calc Af Amer  ?Date Value Ref Range Status  ?07/03/2019 125 >59 mL/min/1.73 Final  ?  Comment:  ?  **[facility] currently reports eGFR in compliance with the current** ?  recommendations of the [employer]. [facility] will ?  update reporting as new guidelines are published from the NKF-ASN ?  Task force. ?  ? ?GFR calc non Af Amer  ?Date Value Ref Range Status  ?07/03/2019 109 >59 mL/min/1.73 Final  ? ?eGFR  ?Date Value Ref Range Status  ?06/04/2020 111 >59 mL/min/1.73 Final  ?   ?  ?  Passed - Valid encounter within last 6 months  ?  Recent Outpatient Visits   ?      ? 2 months ago Type II diabetes mellitus with complication ([place])  ? [facility], [facility]  ? 6 months ago Type II diabetes mellitus with complication [facility])  ? [facility]  ? 10 months ago Type II diabetes mellitus with complication [facility])  ? [facility], [facility]  ? 1 year ago Type II diabetes mellitus with complication [facility])  ? [facility] Glean Hess, MD  ? 1 year ago Type II diabetes  mellitus with complication [facility])  ? [facility] Glean Hess, MD  ?  ?  ?Future Appointments   ?        ? In 4 weeks Glean Hess, MD [facility] [place]  ? In 1 month [facility] Melia Jesse Sans, MD [facility]  ?  ? ?  ?  ?  ?? metFORMIN (GLUCOPHAGE-XR) 500 MG 24 hr tablet [Pharmacy Med Name: METFORMIN ER 500MG 24HR TABS] 180 tablet 0  ?  Sig: TAKE 2 TABLETS(1000 MG) BY MOUTH DAILY  ?  ? Endocrinology:  Diabetes - Biguanides Failed - 04/27/2021 12:13 PM  ?  ?  Failed - B12 Level in normal range and within 720 days  ?  No results found for: VITAMINB12   ?  ?  Passed - Cr in normal range and within 360 days  ?  Creatinine, Ser  ?Date Value Ref Range Status  ?06/04/2020 0.63 0.57 - 1.00 mg/dL Final  ?   ?  ?  Passed - HBA1C is between 0 and 7.9 and within 180 days  ?  Hemoglobin A1C  ?Date Value Ref Range Status  ?02/17/2021 7.0 (A) 4.0 - 5.6 % Final  ? ?Hgb A1c MFr Bld  ?Date Value Ref Range Status  ?06/04/2020 7.4 (H) 4.8 - 5.6 % Final  ?  Comment:  ?           Prediabetes: 5.7 - 6.4 ?         Diabetes: >6.4 ?         Glycemic control for adults with diabetes: <7.0 ?  ?   ?  ?  Passed - eGFR in normal range and within 360 days  ?  GFR calc Af Amer  ?Date Value Ref Range Status  ?07/03/2019 125 >59 mL/min/1.73 Final  ?  Comment:  ?  **[facility] currently reports eGFR in compliance with the current** ?  recommendations of the [employer]. [facility] will ?  update reporting as new guidelines are published from the NKF-ASN ?  Task force. ?  ? ?GFR calc non Af Amer  ?Date Value Ref Range Status  ?07/03/2019 109 >59 mL/min/1.73 Final  ? ?eGFR  ?Date Value Ref Range Status  ?06/04/2020 111 >59 mL/min/1.73 Final  ?   ?  ?  Passed - Valid encounter within last 6 months  ?  Recent Outpatient Visits   ?      ? 2 months ago Type II diabetes mellitus with complication ([place])  ? [facility] Glean [facility], [facility]  ? 6 months ago Type II diabetes mellitus with complication [facility])  ?  [facility] Glean Hess, MD  ? 10 months ago Type II diabetes mellitus with complication [facility])  ? [facility], MD  ? 1 year ago Type II diabetes mellitus with complication [facility])  ? [facility]an [facility], [facility]  ? 1 year ago Type II diabetes mellitus with complication [facility])  ? [facility] Glean Hess, [facility]  ?  ?  ?Future Appointments   ?        ? In 4 weeks Glean Hess, MD [facility] [place]  ? In 1 month Glean Hess, MD [facility]  ?  ? ?  ?  ?  Passed - CBC within normal limits and completed in the last 12 months  ?  WBC  ?Date Value Ref Range Status  ?08/08/2020 6.8 3.4 - 10.8 x10E3/uL Final  ? ?RBC  ?Date Value Ref Range Status  ?08/08/2020 5.18 3.77 - 5.28 x10E6/uL Final  ? ?Hemoglobin  ?Date Value Ref Range Status  ?08/08/2020 11.9 11.1 - 15.9 g/dL Final  ? ?Hematocrit  ?Date Value Ref Range Status  ?08/08/2020 37.5 34.0 - 46.6 % Final  ? ?MCHC  ?Date Value Ref Range Status  ?08/08/2020 31.7 31.5 - 35.7 g/dL Final  ? ?MCH  ?Date Value Ref Range Status  ?08/08/2020 23.0 (L) 26.6 - 33.0 pg Final  ? ?MCV  ?Date Value Ref Range Status  ?08/08/2020 72 (L) 79 - 97 fL Final  ? ?No results found for: PLTCOUNTKUC, LABPLAT, [place] ?RDW  ?Date Value Ref Range Status  ?08/08/2020 17.5 (H) 11.7 - 15.4 % Final  ? ?  ?  ?  ? ?

## 2021-05-26 ENCOUNTER — Ambulatory Visit (INDEPENDENT_AMBULATORY_CARE_PROVIDER_SITE_OTHER): Payer: PRIVATE HEALTH INSURANCE | Admitting: Internal Medicine

## 2021-05-26 ENCOUNTER — Other Ambulatory Visit: Payer: Self-pay

## 2021-05-26 ENCOUNTER — Encounter: Payer: Self-pay | Admitting: Internal Medicine

## 2021-05-26 VITALS — BP 122/70 | HR 73 | Ht 68.0 in | Wt 197.0 lb

## 2021-05-26 DIAGNOSIS — Z1211 Encounter for screening for malignant neoplasm of colon: Secondary | ICD-10-CM

## 2021-05-26 DIAGNOSIS — I10 Essential (primary) hypertension: Secondary | ICD-10-CM | POA: Diagnosis not present

## 2021-05-26 DIAGNOSIS — E118 Type 2 diabetes mellitus with unspecified complications: Secondary | ICD-10-CM

## 2021-05-26 DIAGNOSIS — E785 Hyperlipidemia, unspecified: Secondary | ICD-10-CM

## 2021-05-26 DIAGNOSIS — E1169 Type 2 diabetes mellitus with other specified complication: Secondary | ICD-10-CM | POA: Diagnosis not present

## 2021-05-26 DIAGNOSIS — Z0000 Encounter for general adult medical examination without abnormal findings: Secondary | ICD-10-CM

## 2021-05-26 DIAGNOSIS — D509 Iron deficiency anemia, unspecified: Secondary | ICD-10-CM

## 2021-05-26 LAB — POCT URINALYSIS DIPSTICK
Bilirubin, UA: NEGATIVE
Blood, UA: NEGATIVE
Glucose, UA: POSITIVE — AB
Ketones, UA: NEGATIVE
Leukocytes, UA: NEGATIVE
Nitrite, UA: NEGATIVE
Protein, UA: NEGATIVE
Spec Grav, UA: 1.015 (ref 1.010–1.025)
Urobilinogen, UA: 0.2 E.U./dL
pH, UA: 5 (ref 5.0–8.0)

## 2021-05-26 MED ORDER — NA SULFATE-K SULFATE-MG SULF 17.5-3.13-1.6 GM/177ML PO SOLN: 1.0000 | Freq: Once | ORAL | 0 refills | Status: AC

## 2021-05-26 NOTE — Progress Notes (Signed)
Gastroenterology Pre-Procedure Review ? ?Request Date: 06/22/2021 ?Requesting Physician: Dr. Marius Ditch ? ? ?PATIENT REVIEW QUESTIONS: The patient responded to the following health history questions as indicated:   ? ?1. Are you having any GI issues? no ?2. Do you have a personal history of Polyps? no ?3. Do you have a family history of Colon Cancer or Polyps? no ?4. Diabetes Mellitus? yes (type 2 ) ?5. Joint replacements in the past 12 months?no ?6. Major health problems in the past 3 months?no ?7. Any artificial heart valves, MVP, or defibrillator?no ?   ?MEDICATIONS & ALLERGIES:    ?Patient reports the following regarding taking any anticoagulation/antiplatelet therapy:   ?Plavix, Coumadin, Eliquis, Xarelto, Lovenox, Pradaxa, Brilinta, or Effient? no ?Aspirin? no ? ?Patient confirms/reports the following medications:  ?Current Outpatient Medications  ?Medication Sig Dispense Refill  ? atorvastatin (LIPITOR) 10 MG tablet Take 1 tablet (10 mg total) by mouth daily at 6 PM. 90 tablet 3  ? FARXIGA 10 MG TABS tablet TAKE 1 TABLET(10 MG) BY MOUTH DAILY BEFORE BREAKFAST 90 tablet 0  ? FREESTYLE LITE test strip U UTD ONCE D  1  ? lisinopril (ZESTRIL) 20 MG tablet Take 1 tablet (20 mg total) by mouth daily. 90 tablet 3  ? metFORMIN (GLUCOPHAGE-XR) 500 MG 24 hr tablet TAKE 2 TABLETS(1000 MG) BY MOUTH DAILY 180 tablet 0  ? nystatin-triamcinolone ointment (MYCOLOG) Apply topically 2 (two) times daily.    ? sitaGLIPtin (JANUVIA) 100 MG tablet Take 1 tablet (100 mg total) by mouth daily. 90 tablet 3  ? ?No current facility-administered medications for this visit.  ? ? ?Patient confirms/reports the following allergies:  ?No Known Allergies ? ?No orders of the defined types were placed in this encounter. ? ? ?AUTHORIZATION INFORMATION ?Primary Insurance: ?1D#: ?Group #: ? ?Secondary Insurance: ?1D#: ?Group #: ? ?SCHEDULE INFORMATION: ?Date: 06/22/2021 ?Time: ?Location: ?[facility] ?

## 2021-05-26 NOTE — Progress Notes (Signed)
? ? ?Date:  05/26/2021  ? ?Name:  Alicia Richard   DOB:  02/20/1976   MRN:  [id number] ? ? ?Chief Complaint: Annual Exam (No breast or PAP. See's [facility]. ) ?Alicia Richard is a 46 y.o. female who presents today for her Complete Annual Exam. She feels well. She reports exercising - none. She reports she is sleeping well. Breast complaints - [facility]. ? ?Mammogram: 12/2020 ?DEXA: none ?Pap smear: 08/2017 neg with co-testing @ GYN ?Colonoscopy: due ?Diabetic Eye Exam: Overdue- Scheduled later this month. ? ?Health Maintenance Due  ?Topic Date Due  ? TETANUS/TDAP  Never done  ? COLONOSCOPY (Pts 45-31yr Insurance coverage will need to be confirmed)  Never done  ?  ?Immunization History  ?Administered Date(s) Administered  ? Influenza,inj,Quad PF,6+ Mos 11/27/2014, 10/26/2018, 11/13/2019  ? Influenza,inj,quad, With Preservative 12/07/2017  ? Influenza-Unspecified 12/06/2016, 12/11/2017, 12/23/2020  ? PFIZER(Purple Top)SARS-COV-2 Vaccination 05/18/2019, 06/08/2019, 03/07/2020  ? Pneumococcal Polysaccharide-23 09/01/2016  ? ? ?Hypertension ?This is a chronic problem. The problem is controlled. Pertinent negatives include no chest pain, headaches, palpitations or shortness of breath. Past treatments include ACE inhibitors. The current treatment provides significant improvement. There is no history of CVA.  ?Diabetes ?She presents for her follow-up diabetic visit. She has type 2 diabetes mellitus. Her disease course has been stable. Pertinent negatives for hypoglycemia include no dizziness, headaches, nervousness/anxiousness or tremors. Pertinent negatives for diabetes include no chest pain, no fatigue, no polydipsia and no polyuria. Pertinent negatives for diabetic complications include no CVA, heart disease, nephropathy or peripheral neuropathy. Current diabetic treatment includes oral agent (dual therapy) (farxiga and metformin). An ACE inhibitor/angiotensin II receptor blocker is being taken. Eye exam is  current.  ?Hyperlipidemia ?This is a chronic problem. The problem is controlled. Pertinent negatives include no chest pain or shortness of breath. Current antihyperlipidemic treatment includes statins.  ?Anemia ?Presents for follow-up visit. There has been no abdominal pain, bruising/bleeding easily, fever, light-headedness or palpitations. Signs of blood loss that are not present include vaginal bleeding.  ? ?Lab Results  ?Component Value Date  ? NA 138 06/04/2020  ? K 4.4 06/04/2020  ? CO2 22 06/04/2020  ? GLUCOSE 122 (H) 06/04/2020  ? BUN 9 06/04/2020  ? CREATININE 0.63 06/04/2020  ? CALCIUM 9.8 06/04/2020  ? EGFR 111 06/04/2020  ? GFRNONAA 109 07/03/2019  ? ?Lab Results  ?Component Value Date  ? CHOL 133 06/04/2020  ? HDL 41 06/04/2020  ? LWeiner70 06/04/2020  ? TRIG 122 06/04/2020  ? CHOLHDL 3.2 06/04/2020  ? ?Lab Results  ?Component Value Date  ? TSH 2.230 07/03/2019  ? ?Lab Results  ?Component Value Date  ? HGBA1C 7.0 (A) 02/17/2021  ? ?Lab Results  ?Component Value Date  ? WBC 6.8 08/08/2020  ? HGB 11.9 08/08/2020  ? HCT 37.5 08/08/2020  ? MCV 72 (L) 08/08/2020  ? PLT 284 08/08/2020  ? ?Lab Results  ?Component Value Date  ? ALT 21 06/04/2020  ? AST 17 06/04/2020  ? ALKPHOS 94 06/04/2020  ? BILITOT 0.6 06/04/2020  ? ?No results found for: 25OHVITD2, 2[place] VD25OH  ? ?Review of Systems  ?Constitutional:  Negative for chills, fatigue and fever.  ?HENT:  Negative for congestion, hearing loss, tinnitus, trouble swallowing and voice change.   ?Eyes:  Negative for visual disturbance.  ?Respiratory:  Negative for cough, chest tightness, shortness of breath and wheezing.   ?Cardiovascular:  Negative for chest pain, palpitations and leg swelling.  ?Gastrointestinal:  Negative for abdominal  pain, constipation, diarrhea and vomiting.  ?Endocrine: Negative for polydipsia and polyuria.  ?Genitourinary:  Positive for menstrual problem (irregular). Negative for dysuria, frequency, genital sores, vaginal bleeding and  vaginal discharge.  ?Musculoskeletal:  Negative for arthralgias, gait problem and joint swelling.  ?Skin:  Negative for color change and rash.  ?Neurological:  Negative for dizziness, tremors, light-headedness and headaches.  ?Hematological:  Negative for adenopathy. Does not bruise/bleed easily.  ?Psychiatric/Behavioral:  Negative for dysphoric mood and sleep disturbance. The patient is not nervous/anxious.   ? ?Patient Active Problem List  ? Diagnosis Date Noted  ? Iron deficiency anemia 10/10/2020  ? Heart murmur 06/04/2020  ? Hyperlipidemia associated with type 2 diabetes mellitus ([place]) 10/11/2014  ? Essential (primary) hypertension 08/14/2014  ? Type II diabetes mellitus with complication ([place]) 54/27/0623  ? ? ?No Known Allergies ? ?Past Surgical History:  ?Procedure Laterality Date  ? BREAST BIOPSY Right 10/10/2015  ? Stereotactic biopsy - benign  ? WISDOM TOOTH EXTRACTION    ? ? ?Social History  ? ?Tobacco Use  ? Smoking status: Never  ? Smokeless tobacco: Never  ?Vaping Use  ? Vaping Use: Never used  ?Substance Use Topics  ? Alcohol use: No  ?  Alcohol/week: 0.0 standard drinks  ? Drug use: No  ? ? ? ?Medication list has been reviewed and updated. ? ?Current Meds  ?Medication Sig  ? atorvastatin (LIPITOR) 10 MG tablet Take 1 tablet (10 mg total) by mouth daily at 6 PM.  ? FARXIGA 10 MG TABS tablet TAKE 1 TABLET(10 MG) BY MOUTH DAILY BEFORE BREAKFAST  ? FREESTYLE LITE test strip U UTD ONCE D  ? lisinopril (ZESTRIL) 20 MG tablet Take 1 tablet (20 mg total) by mouth daily.  ? metFORMIN (GLUCOPHAGE-XR) 500 MG 24 hr tablet TAKE 2 TABLETS(1000 MG) BY MOUTH DAILY  ? nystatin-triamcinolone ointment (MYCOLOG) Apply topically 2 (two) times daily.  ? sitaGLIPtin (JANUVIA) 100 MG tablet Take 1 tablet (100 mg total) by mouth daily.  ? ? ? ?  05/26/2021  ?  8:30 AM 02/17/2021  ?  8:49 AM 10/10/2020  ?  8:13 AM 06/04/2020  ?  8:10 AM  ?GAD 7 : Generalized Anxiety Score  ?Nervous, Anxious, on Edge 1 1 0 0  ?Control/stop  worrying 1 0 0 0  ?Worry too much - different things 0 0 0 0  ?Trouble relaxing 0 0 0 0  ?Restless 0 0 0 0  ?Easily annoyed or irritable 0 0 0 0  ?Afraid - awful might happen 0 0 0 0  ?Total GAD 7 Score 2 1 0 0  ?Anxiety Difficulty Not difficult at all     ? ? ? ?  05/26/2021  ?  8:30 AM  ?Depression screen PHQ 2/9  ?Decreased Interest 0  ?Down, Depressed, Hopeless 1  ?PHQ - 2 Score 1  ?Altered sleeping 0  ?Tired, decreased energy 1  ?Change in appetite 1  ?Feeling bad or failure about yourself  0  ?Trouble concentrating 0  ?Moving slowly or fidgety/restless 0  ?Suicidal thoughts 0  ?PHQ-9 Score 3  ?Difficult doing work/chores Not difficult at all  ? ? ?BP Readings from Last 3 Encounters:  ?05/26/21 122/70  ?02/17/21 106/82  ?10/10/20 104/72  ? ? ?Physical Exam ?Vitals and nursing note reviewed.  ?Constitutional:   ?   General: She is not in acute distress. ?   Appearance: She is well-developed.  ?HENT:  ?   Head: Normocephalic and atraumatic.  ?  Right Ear: Tympanic membrane and ear canal normal.  ?   Left Ear: Tympanic membrane and ear canal normal.  ?   Nose:  ?   Right Sinus: No maxillary sinus tenderness.  ?   Left Sinus: No maxillary sinus tenderness.  ?Eyes:  ?   General: No scleral icterus.    ?   Right eye: No discharge.     ?   Left eye: No discharge.  ?   Conjunctiva/sclera: Conjunctivae normal.  ?Neck:  ?   Thyroid: No thyromegaly.  ?   Vascular: No carotid bruit.  ?Cardiovascular:  ?   Rate and Rhythm: Normal rate and regular rhythm.  ?   Pulses: Normal pulses.  ?   Heart sounds: Normal heart sounds.  ?Pulmonary:  ?   Effort: Pulmonary effort is normal. No respiratory distress.  ?   Breath sounds: No wheezing.  ?Chest:  ?Breasts: ?   Right: No mass, nipple discharge, skin change or tenderness.  ?   Left: No mass, nipple discharge, skin change or tenderness.  ?Abdominal:  ?   General: Bowel sounds are normal.  ?   Palpations: Abdomen is soft.  ?   Tenderness: There is no abdominal tenderness.   ?Musculoskeletal:  ?   Cervical back: Normal range of motion. No erythema.  ?   Right lower leg: No edema.  ?   Left lower leg: No edema.  ?Lymphadenopathy:  ?   Cervical: No cervical adenopathy.  ?Skin: ?   General: Skin is w

## 2021-05-27 ENCOUNTER — Other Ambulatory Visit: Payer: Self-pay

## 2021-05-27 LAB — CBC WITH DIFFERENTIAL/PLATELET
Basophils Absolute: 0.1 10*3/uL (ref 0.0–0.2)
Basos: 1 %
EOS (ABSOLUTE): 0.1 10*3/uL (ref 0.0–0.4)
Eos: 2 %
Hematocrit: 35.8 % (ref 34.0–46.6)
Hemoglobin: 12.1 g/dL (ref 11.1–15.9)
Immature Grans (Abs): 0 10*3/uL (ref 0.0–0.1)
Immature Granulocytes: 0 %
Lymphocytes Absolute: 2 10*3/uL (ref 0.7–3.1)
Lymphs: 34 %
MCH: 26.4 pg — ABNORMAL LOW (ref 26.6–33.0)
MCHC: 33.8 g/dL (ref 31.5–35.7)
MCV: 78 fL — ABNORMAL LOW (ref 79–97)
Monocytes Absolute: 0.4 10*3/uL (ref 0.1–0.9)
Monocytes: 8 %
Neutrophils Absolute: 3.3 10*3/uL (ref 1.4–7.0)
Neutrophils: 55 %
Platelets: 255 10*3/uL (ref 150–450)
RBC: 4.58 x10E6/uL (ref 3.77–5.28)
RDW: 13 % (ref 11.7–15.4)
WBC: 5.9 10*3/uL (ref 3.4–10.8)

## 2021-05-27 LAB — COMPREHENSIVE METABOLIC PANEL
ALT: 12 IU/L (ref 0–32)
AST: 12 IU/L (ref 0–40)
Albumin/Globulin Ratio: 2.5 — ABNORMAL HIGH (ref 1.2–2.2)
Albumin: 4.5 g/dL (ref 3.8–4.8)
Alkaline Phosphatase: 96 IU/L (ref 44–121)
BUN/Creatinine Ratio: 14 (ref 9–23)
BUN: 8 mg/dL (ref 6–24)
Bilirubin Total: 0.3 mg/dL (ref 0.0–1.2)
CO2: 23 mmol/L (ref 20–29)
Calcium: 9.1 mg/dL (ref 8.7–10.2)
Chloride: 103 mmol/L (ref 96–106)
Creatinine, Ser: 0.58 mg/dL (ref 0.57–1.00)
Globulin, Total: 1.8 g/dL (ref 1.5–4.5)
Glucose: 135 mg/dL — ABNORMAL HIGH (ref 70–99)
Potassium: 4.1 mmol/L (ref 3.5–5.2)
Sodium: 139 mmol/L (ref 134–144)
Total Protein: 6.3 g/dL (ref 6.0–8.5)
eGFR: 112 mL/min/{1.73_m2} (ref >59)

## 2021-05-27 LAB — TSH: TSH: 1.5 u[IU]/mL (ref 0.450–4.500)

## 2021-05-27 LAB — LIPID PANEL
Chol/HDL Ratio: 3.4 ratio (ref 0.0–4.4)
Cholesterol, Total: 134 mg/dL (ref 100–199)
HDL: 40 mg/dL (ref >39)
LDL Chol Calc (NIH): 73 mg/dL (ref 0–99)
Triglycerides: 115 mg/dL (ref 0–149)
VLDL Cholesterol Cal: 21 mg/dL (ref 5–40)

## 2021-05-27 LAB — IRON,TIBC AND FERRITIN PANEL
Ferritin: 21 ng/mL (ref 15–150)
Iron Saturation: 10 % — ABNORMAL LOW (ref 15–55)
Iron: 33 ug/dL (ref 27–159)
Total Iron Binding Capacity: 336 ug/dL (ref 250–450)
UIBC: 303 ug/dL (ref 131–425)

## 2021-05-27 LAB — HEMOGLOBIN A1C
Est. average glucose Bld gHb Est-mCnc: 169 mg/dL
Hgb A1c MFr Bld: 7.5 % — ABNORMAL HIGH (ref 4.8–5.6)

## 2021-06-09 ENCOUNTER — Ambulatory Visit: Payer: PRIVATE HEALTH INSURANCE | Admitting: Internal Medicine

## 2021-06-10 LAB — HM DIABETES EYE EXAM

## 2021-06-22 ENCOUNTER — Encounter
Admission: RE | Disposition: A | Discharge status: Home / Self Care | Payer: Self-pay | Source: Home / Self Care | Attending: Gastroenterology

## 2021-06-22 ENCOUNTER — Ambulatory Visit: Payer: PRIVATE HEALTH INSURANCE | Admitting: Registered Nurse

## 2021-06-22 ENCOUNTER — Encounter: Payer: Self-pay | Admitting: Gastroenterology

## 2021-06-22 ENCOUNTER — Ambulatory Visit
Admission: RE | Admit: 2021-06-22 | Discharge: 2021-06-22 | Disposition: A | Discharge status: Home / Self Care | Payer: PRIVATE HEALTH INSURANCE | Attending: Gastroenterology | Admitting: Gastroenterology

## 2021-06-22 DIAGNOSIS — Z87891 Personal history of nicotine dependence: Secondary | ICD-10-CM | POA: Insufficient documentation

## 2021-06-22 DIAGNOSIS — I10 Essential (primary) hypertension: Secondary | ICD-10-CM | POA: Diagnosis not present

## 2021-06-22 DIAGNOSIS — D122 Benign neoplasm of ascending colon: Secondary | ICD-10-CM | POA: Diagnosis not present

## 2021-06-22 DIAGNOSIS — Z1211 Encounter for screening for malignant neoplasm of colon: Secondary | ICD-10-CM | POA: Diagnosis not present

## 2021-06-22 DIAGNOSIS — Z7984 Long term (current) use of oral hypoglycemic drugs: Secondary | ICD-10-CM | POA: Insufficient documentation

## 2021-06-22 DIAGNOSIS — E119 Type 2 diabetes mellitus without complications: Secondary | ICD-10-CM | POA: Insufficient documentation

## 2021-06-22 DIAGNOSIS — E785 Hyperlipidemia, unspecified: Secondary | ICD-10-CM | POA: Insufficient documentation

## 2021-06-22 HISTORY — PX: COLONOSCOPY WITH PROPOFOL: SHX5780

## 2021-06-22 LAB — GLUCOSE, CAPILLARY: Glucose-Capillary: 151 mg/dL — ABNORMAL HIGH (ref 70–99)

## 2021-06-22 LAB — POCT PREGNANCY, URINE: Preg Test, Ur: NEGATIVE

## 2021-06-22 SURGERY — COLONOSCOPY WITH PROPOFOL
Anesthesia: General

## 2021-06-22 MED ORDER — LIDOCAINE HCL (CARDIAC) PF 100 MG/5ML IV SOSY
PREFILLED_SYRINGE | INTRAVENOUS | Status: DC | PRN
  Administered 2021-06-22: 60 mg via INTRAVENOUS

## 2021-06-22 MED ORDER — DEXMEDETOMIDINE (PRECEDEX) IN NS 20 MCG/5ML (4 MCG/ML) IV SYRINGE
PREFILLED_SYRINGE | INTRAVENOUS | Status: DC | PRN
  Administered 2021-06-22: 8 ug via INTRAVENOUS

## 2021-06-22 MED ORDER — SODIUM CHLORIDE 0.9 % IV SOLN: INTRAVENOUS | Status: DC

## 2021-06-22 MED ORDER — PROPOFOL 500 MG/50ML IV EMUL
INTRAVENOUS | Status: DC | PRN
  Administered 2021-06-22: 140 ug/kg/min via INTRAVENOUS

## 2021-06-22 MED ORDER — PROPOFOL 10 MG/ML IV BOLUS
INTRAVENOUS | Status: DC | PRN
  Administered 2021-06-22: 70 mg via INTRAVENOUS
  Administered 2021-06-22: 30 mg via INTRAVENOUS

## 2021-06-22 NOTE — H&P (Signed)
?Cephas Darby, MD ?[address]  ?[address]  ?[place], [place] [address]  ?Main: [phone]  ?Fax: [phone] ?Pager: [phone] ? ?Primary Care Physician:  Glean Hess, MD ?Primary Gastroenterologist:  Dr. Cephas Darby ? ?Pre-Procedure History & Physical: ?HPI:  Alicia Richard is a 46 y.o. female is here for an colonoscopy. ?  ?Past Medical History:  ?Diagnosis Date  ? Diabetes mellitus without complication ([place])   ? Hyperlipidemia   ? ? ?Past Surgical History:  ?Procedure Laterality Date  ? BREAST BIOPSY Right 10/10/2015  ? Stereotactic biopsy - benign  ? WISDOM TOOTH EXTRACTION    ? ? ?Prior to Admission medications   ?Medication Sig Start Date End Date Taking? Authorizing Provider  ?atorvastatin (LIPITOR) 10 MG tablet Take 1 tablet (10 mg total) by mouth daily at 6 PM. 10/10/20  Yes Glean Hess, MD  ?FARXIGA 10 MG TABS tablet TAKE 1 TABLET(10 MG) BY MOUTH DAILY BEFORE BREAKFAST 04/28/21  Yes Glean Hess, MD  ?lisinopril (ZESTRIL) 20 MG tablet Take 1 tablet (20 mg total) by mouth daily. 10/10/20  Yes Glean Hess, MD  ?metFORMIN (GLUCOPHAGE-XR) 500 MG 24 hr tablet TAKE 2 TABLETS(1000 MG) BY MOUTH DAILY 04/28/21  Yes Glean Hess, MD  ?sitaGLIPtin (JANUVIA) 100 MG tablet Take 1 tablet (100 mg total) by mouth daily. 02/17/21  Yes Glean Hess, MD  ?FREESTYLE LITE test strip U UTD ONCE D 10/21/14   [provider]  ?nystatin-triamcinolone ointment (MYCOLOG) Apply topically 2 (two) times daily. 11/27/19   [provider]  ? ? ?Allergies as of 05/26/2021  ? (No Known Allergies)  ? ? ?Family History  ?Problem Relation Age of Onset  ? Hypertension Mother   ? Diabetes Maternal Grandmother   ? Diabetes Paternal Grandmother   ? Breast cancer Neg Hx   ? ? ?Social History  ? ?Socioeconomic History  ? Marital status: Married  ?  Spouse name: Not on file  ? Number of children: Not on file  ? Years of education: Not on file  ? Highest education level: Not on file   ?Occupational History  ? Not on file  ?Tobacco Use  ? Smoking status: Never  ? Smokeless tobacco: Never  ?Vaping Use  ? Vaping Use: Never used  ?Substance and Sexual Activity  ? Alcohol use: No  ?  Alcohol/week: 0.0 standard drinks  ? Drug use: No  ? Sexual activity: Yes  ?Other Topics Concern  ? Not on file  ?Social History Narrative  ? Not on file  ? ?Social Determinants of Health  ? ?Financial Resource Strain: Not on file  ?Food Insecurity: Not on file  ?Transportation Needs: Not on file  ?Physical Activity: Not on file  ?Stress: Not on file  ?Social Connections: Not on file  ?Intimate Partner Violence: Not on file  ? ? ?Review of Systems: ?See HPI, otherwise negative ROS ? ?Physical Exam: ?BP 131/82   Pulse 69   Temp (!) 97 ?F (36.1 ?C) (Temporal)   Resp 18   Ht '5\' 8"'$  (1.727 m)   Wt 89.8 kg   SpO2 100%   BMI 30.11 kg/m?  ?General:   Alert,  pleasant and cooperative in NAD ?Head:  Normocephalic and atraumatic. ?Neck:  Supple; no masses or thyromegaly. ?Lungs:  Clear throughout to auscultation.    ?Heart:  Regular rate and rhythm. ?Abdomen:  Soft, nontender and nondistended. Normal bowel sounds, without guarding, and without rebound.   ?Neurologic:  Alert and  oriented x4;  grossly normal neurologically. ? ?Impression/Plan: ?Ivana Nicastro is here for an colonoscopy to be performed for colon cancer screening ? ?Risks, benefits, limitations, and alternatives regarding  colonoscopy have been reviewed with the patient.  Questions have been answered.  All parties agreeable. ? ? ?Sherri Sear, MD  06/22/2021, 8:13 AM ?

## 2021-06-22 NOTE — Op Note (Signed)
[facility] ?Gastroenterology ?Patient Name: Alicia Richard ?Procedure Date: 06/22/2021 8:00 AM ?MRN: [id number] ?Account #: [id number] ?Date of Birth: 1975-08-24 ?Admit Type: Outpatient ?Age: 46 ?Room: [facility] ENDO ROOM 1 ?Gender: Female ?Note Status: Finalized ?Instrument Name: Colonscope [id number] ?Procedure:             Colonoscopy ?Indications:           Screening for colorectal malignant neoplasm, This is  ?                       the patient's first colonoscopy ?Providers:             Lin Landsman MD, MD ?Referring MD:          [name], MD (Referring MD) ?Medicines:             General Anesthesia ?Complications:         No immediate complications. Estimated blood loss: None. ?Procedure:             Pre-Anesthesia Assessment: ?                       - Prior to the procedure, a History and Physical was  ?                       performed, and patient medications and allergies were  ?                       reviewed. The patient is competent. The risks and  ?                       benefits of the procedure and the sedation options and  ?                       risks were discussed with the patient. All questions  ?                       were answered and informed consent was obtained.  ?                       Patient identification and proposed procedure were  ?                       verified by the physician, the nurse, the  ?                       anesthesiologist, the anesthetist and the technician  ?                       in the pre-procedure area in the procedure room in the  ?                       endoscopy suite. Mental Status Examination: alert and  ?                       oriented. Airway Examination: normal oropharyngeal  ?                       airway and neck mobility. Respiratory Examination:  ?  clear to auscultation. CV Examination: normal.  ?                       Prophylactic Antibiotics: The patient does not require  ?                       prophylactic  antibiotics. Prior Anticoagulants: The  ?                       patient has taken no previous anticoagulant or  ?                       antiplatelet agents. ASA Grade Assessment: II - A  ?                       patient with mild systemic disease. After reviewing  ?                       the risks and benefits, the patient was deemed in  ?                       satisfactory condition to undergo the procedure. The  ?                       anesthesia plan was to use general anesthesia.  ?                       Immediately prior to administration of medications,  ?                       the patient was re-assessed for adequacy to receive  ?                       sedatives. The heart rate, respiratory rate, oxygen  ?                       saturations, blood pressure, adequacy of pulmonary  ?                       ventilation, and response to care were monitored  ?                       throughout the procedure. The physical status of the  ?                       patient was re-assessed after the procedure. ?                       After obtaining informed consent, the colonoscope was  ?                       passed under direct vision. Throughout the procedure,  ?                       the patient's blood pressure, pulse, and oxygen  ?                       saturations were monitored continuously. The  ?  Colonoscope was introduced through the anus and  ?                       advanced to the the terminal ileum, with  ?                       identification of the appendiceal orifice and IC  ?                       valve. The colonoscopy was performed without  ?                       difficulty. The patient tolerated the procedure well.  ?                       The quality of the bowel preparation was evaluated  ?                       using the BBPS [facility] Bowel Preparation Scale) with  ?                       scores of: Right Colon = 3, Transverse Colon = 3 and  ?                       Left Colon =  3 (entire mucosa seen well with no  ?                       residual staining, small fragments of stool or opaque  ?                       liquid). The total BBPS score equals 9. ?Findings: ?     The perianal and digital rectal examinations were normal. Pertinent  ?     negatives include normal sphincter tone and no palpable rectal lesions. ?     A 6 mm polyp was found in the ascending colon. The polyp was sessile.  ?     The polyp was removed with a cold snare. Resection and retrieval were  ?     complete. Estimated blood loss: none. ?     The exam was otherwise without abnormality. ?     The retroflexed view of the distal rectum and anal verge was normal and  ?     showed no anal or rectal abnormalities. ?     The terminal ileum appeared normal. ?Impression:            - One 6 mm polyp in the ascending colon, removed with  ?                       a cold snare. Resected and retrieved. ?                       - The examination was otherwise normal. ?                       - The distal rectum and anal verge are normal on  ?                       retroflexion view. ?                       -  The examined portion of the ileum was normal. ?Recommendation:        - Discharge patient to home (with escort). ?                       - Resume previous diet today. ?                       - Continue present medications. ?                       - Await pathology results. ?                       - Repeat colonoscopy in 5 years for surveillance. ?Procedure Code(s):     --- Professional --- ?                       [phone], Colonoscopy, flexible; with removal of  ?                       tumor(s), polyp(s), or other lesion(s) by snare  ?                       technique ?Diagnosis Code(s):     --- Professional --- ?                       Z12.11, Encounter for screening for malignant neoplasm  ?                       of colon ?                       K63.5, Polyp of colon ?CPT copyright 2019 American Medical Association. All rights  reserved. ?The codes documented in this report are preliminary and upon coder review may  ?be revised to meet current compliance requirements. ?Dr. Ulyess Mort ?[employer] Raeanne Gathers MD, MD ?06/22/2021 8:33:10 AM ?This report has been signed electronically. ?Number of Addenda: 0 ?Note Initiated On: 06/22/2021 8:00 AM ?Scope Withdrawal Time: 0 hours 9 minutes 7 seconds  ?Total Procedure Duration: 0 hours 11 minutes 7 seconds  ?Estimated Blood Loss:  Estimated blood loss: none. ?     [facility] ?

## 2021-06-22 NOTE — Anesthesia Postprocedure Evaluation (Signed)
Anesthesia Post Note ? ?Patient: Alicia Richard ? ?Procedure(s) Performed: COLONOSCOPY WITH PROPOFOL ? ?Patient location during evaluation: Endoscopy ?Anesthesia Type: General ?Level of consciousness: awake and alert ?Pain management: pain level controlled ?Vital Signs Assessment: post-procedure vital signs reviewed and stable ?Respiratory status: spontaneous breathing, nonlabored ventilation, respiratory function stable and patient connected to nasal cannula oxygen ?Cardiovascular status: blood pressure returned to baseline and stable ?Postop Assessment: no apparent nausea or vomiting ?Anesthetic complications: no ? ? ?No notable events documented. ? ? ?Last Vitals:  ?Vitals:  ? 06/22/21 0841 06/22/21 0851  ?BP: 98/69 100/76  ?Pulse: 63 60  ?Resp: (!) 22 19  ?Temp:    ?SpO2: 98% 100%  ?  ?Last Pain:  ?Vitals:  ? 06/22/21 0851  ?TempSrc:   ?PainSc: 0-No pain  ? ? ?  ?  ?  ?  ?  ?  ? ?Arita Miss ? ? ? ? ?

## 2021-06-22 NOTE — Transfer of Care (Signed)
Immediate Anesthesia Transfer of Care Note ? ?Patient: Alicia Richard ? ?Procedure(s) Performed: COLONOSCOPY WITH PROPOFOL ? ?Patient Location: PACU ? ?Anesthesia Type:General ? ?Level of Consciousness: awake, alert  and oriented ? ?Airway & Oxygen Therapy: spontaneous respirations on room air ? ?Post-op Assessment: Report given to RN and Post -op Vital signs reviewed and stable ? ?Post vital signs: Reviewed and stable ? ?Last Vitals:  ?Vitals Value Taken Time  ?BP 90/61 06/22/21 0832  ?Temp 36.4 ?C 06/22/21 0831  ?Pulse 70 06/22/21 0832  ?Resp 19 06/22/21 0832  ?SpO2 98 % 06/22/21 0832  ?Vitals shown include unvalidated device data. ? ?Last Pain:  ?Vitals:  ? 06/22/21 0831  ?TempSrc: Temporal  ?PainSc: Asleep  ?   ? ?  ? ?Complications: No notable events documented. ?

## 2021-06-22 NOTE — Anesthesia Preprocedure Evaluation (Signed)
Anesthesia Evaluation  ?Patient identified by MRN, date of birth, ID band ?Patient awake ? ? ? ?Reviewed: ?Allergy & Precautions, NPO status , Patient's Chart, lab work & pertinent test results ? ?History of Anesthesia Complications ?Negative for: history of anesthetic complications ? ?Airway ?Mallampati: II ? ?TM Distance: >3 FB ?Neck ROM: Full ? ? ? Dental ?no notable dental hx. ?(+) Teeth Intact ?  ?Pulmonary ?neg pulmonary ROS, neg sleep apnea, neg COPD, Patient abstained from smoking.Not current smoker,  ?  ?Pulmonary exam normal ?breath sounds clear to auscultation ? ? ? ? ? ? Cardiovascular ?Exercise Tolerance: Good ?METShypertension, (-) CAD and (-) Past MI (-) dysrhythmias  ?Rhythm:Regular Rate:Normal ?- Systolic murmurs ? ?  ?Neuro/Psych ?negative neurological ROS ? negative psych ROS  ? GI/Hepatic ?neg GERD  ,(+)  ?  ? (-) substance abuse ? ,   ?Endo/Other  ?diabetes, Oral Hypoglycemic Agents ? Renal/GU ?negative Renal ROS  ? ?  ?Musculoskeletal ? ? Abdominal ?  ?Peds ? Hematology ?  ?Anesthesia Other Findings ?Past Medical History: ?No date: Diabetes mellitus without complication ([place]) ?No date: Hyperlipidemia ? Reproductive/Obstetrics ? ?  ? ? ? ? ? ? ? ? ? ? ? ? ? ?  ?  ? ? ? ? ? ? ? ? ?Anesthesia Physical ?Anesthesia Plan ? ?ASA: 2 ? ?Anesthesia Plan: General  ? ?Post-op Pain Management: Minimal or no pain anticipated  ? ?Induction: Intravenous ? ?PONV Risk Score and Plan: 3 and Propofol infusion, TIVA and Ondansetron ? ?Airway Management Planned: Nasal Cannula ? ?Additional Equipment: None ? ?Intra-op Plan:  ? ?Post-operative Plan:  ? ?Informed Consent: I have reviewed the patients History and Physical, chart, labs and discussed the procedure including the risks, benefits and alternatives for the proposed anesthesia with the patient or authorized representative who has indicated his/her understanding and acceptance.  ? ? ? ?Dental advisory given ? ?Plan Discussed  with: CRNA and Surgeon ? ?Anesthesia Plan Comments: (Discussed risks of anesthesia with patient, including possibility of difficulty with spontaneous ventilation under anesthesia necessitating airway intervention, PONV, and rare risks such as cardiac or respiratory or neurological events, and allergic reactions. Discussed the role of CRNA in patient's perioperative care. Patient understands.)  ? ? ? ? ? ? ?Anesthesia Quick Evaluation ? ?

## 2021-06-23 ENCOUNTER — Encounter: Payer: Self-pay | Admitting: Gastroenterology

## 2021-06-23 LAB — SURGICAL PATHOLOGY

## 2021-07-07 ENCOUNTER — Encounter: Payer: Self-pay | Admitting: Internal Medicine

## 2021-08-04 ENCOUNTER — Other Ambulatory Visit: Payer: Self-pay | Admitting: Internal Medicine

## 2021-08-04 DIAGNOSIS — E118 Type 2 diabetes mellitus with unspecified complications: Secondary | ICD-10-CM

## 2021-08-05 NOTE — Telephone Encounter (Signed)
Requested Prescriptions  Pending Prescriptions Disp Refills  . FARXIGA 10 MG TABS tablet [Pharmacy Med Name: FARXIGA 10MG TABLETS] 90 tablet 0    Sig: TAKE 1 TABLET(10 MG) BY MOUTH DAILY BEFORE BREAKFAST     Endocrinology:  Diabetes - SGLT2 Inhibitors Passed - 08/04/2021  2:41 PM      Passed - Cr in normal range and within 360 days    Creatinine, Ser  Date Value Ref Range Status  05/26/2021 0.58 0.57 - 1.00 mg/dL Final         Passed - HBA1C is between 0 and 7.9 and within 180 days    Hgb A1c MFr Bld  Date Value Ref Range Status  05/26/2021 7.5 (H) 4.8 - 5.6 % Final    Comment:             Prediabetes: 5.7 - 6.4          Diabetes: >6.4          Glycemic control for adults with diabetes: <7.0          Passed - eGFR in normal range and within 360 days    GFR calc Af Amer  Date Value Ref Range Status  07/03/2019 125 >59 mL/min/1.73 Final    Comment:    **[facility] currently reports eGFR in compliance with the current**   recommendations of the [employer]. [facility] will   update reporting as new guidelines are published from the NKF-ASN   Task force.    GFR calc non Af Amer  Date Value Ref Range Status  07/03/2019 109 >59 mL/min/1.73 Final   eGFR  Date Value Ref Range Status  05/26/2021 112 >59 mL/min/1.73 Final         Passed - Valid encounter within last 6 months    Recent Outpatient Visits          2 months ago Annual physical exam   [facility] Glean Hess, MD   5 months ago Type II diabetes mellitus with complication [facility])   [facility] Glean Hess, MD   9 months ago Type II diabetes mellitus with complication [facility])   [facility] Glean Hess, MD   1 year ago Type II diabetes mellitus with complication [facility])   [facility] Glean Hess, MD   1 year ago Type II diabetes mellitus with complication [facility])   [facility] Glean Hess, MD      Future Appointments             In 2 months Glean Hess, MD [facility] [place]   In 9 months Glean Hess, MD [facility]           . metFORMIN (GLUCOPHAGE-XR) 500 MG 24 hr tablet [Pharmacy Med Name: METFORMIN ER 500MG 24HR TABS] 180 tablet 0    Sig: TAKE 2 TABLETS(1000 MG) BY MOUTH DAILY     Endocrinology:  Diabetes - Biguanides Failed - 08/04/2021  2:41 PM      Failed - B12 Level in normal range and within 720 days    No results found for: "VITAMINB12"       Passed - Cr in normal range and within 360 days    Creatinine, Ser  Date Value Ref Range Status  05/26/2021 0.58 0.57 - 1.00 mg/dL Final         Passed - HBA1C is between 0 and 7.9 and within 180 days  Hgb A1c MFr Bld  Date Value Ref Range Status  05/26/2021 7.5 (H) 4.8 - 5.6 % Final    Comment:             Prediabetes: 5.7 - 6.4          Diabetes: >6.4          Glycemic control for adults with diabetes: <7.0          Passed - eGFR in normal range and within 360 days    GFR calc Af Amer  Date Value Ref Range Status  07/03/2019 125 >59 mL/min/1.73 Final    Comment:    **[facility] currently reports eGFR in compliance with the current**   recommendations of the [employer]. [facility] will   update reporting as new guidelines are published from the NKF-ASN   Task force.    GFR calc non Af Amer  Date Value Ref Range Status  07/03/2019 109 >59 mL/min/1.73 Final   eGFR  Date Value Ref Range Status  05/26/2021 112 >59 mL/min/1.73 Final         Passed - Valid encounter within last 6 months    Recent Outpatient Visits          2 months ago Annual physical exam   [facility] Glean Hess, MD   5 months ago Type II diabetes mellitus with complication [facility])   [facility] Glean Hess, MD   9 months ago Type II diabetes mellitus with complication [facility])   [facility] Glean Hess, MD   1 year ago Type II diabetes mellitus with complication [facility])   [facility] Glean Hess, MD   1 year ago Type II diabetes mellitus with complication [facility])   [facility] Glean Hess, MD      Future Appointments            In 2 months Glean Hess, MD [facility] [place]   In 9 months Glean Hess, MD [facility] [place] within normal limits and completed in the last 12 months    WBC  Date Value Ref Range Status  05/26/2021 5.9 3.4 - 10.8 x10E3/uL Final   RBC  Date Value Ref Range Status  05/26/2021 4.58 3.77 - 5.28 x10E6/uL Final   Hemoglobin  Date Value Ref Range Status  05/26/2021 12.1 11.1 - 15.9 g/dL Final   Hematocrit  Date Value Ref Range Status  05/26/2021 35.8 34.0 - 46.6 % Final   MCHC  Date Value Ref Range Status  05/26/2021 33.8 31.5 - 35.7 g/dL Final   [facility]  Date Value Ref Range Status  05/26/2021 26.4 (L) 26.6 - 33.0 pg Final   MCV  Date Value Ref Range Status  05/26/2021 78 (L) 79 - 97 fL Final   No results found for: "PLTCOUNTKUC", "LABPLAT", "POCPLA" RDW  Date Value Ref Range Status  05/26/2021 13.0 11.7 - 15.4 % Final

## 2021-10-08 ENCOUNTER — Ambulatory Visit: Payer: PRIVATE HEALTH INSURANCE | Admitting: Internal Medicine

## 2021-11-04 ENCOUNTER — Ambulatory Visit (INDEPENDENT_AMBULATORY_CARE_PROVIDER_SITE_OTHER): Payer: PRIVATE HEALTH INSURANCE | Admitting: Internal Medicine

## 2021-11-04 ENCOUNTER — Encounter: Payer: Self-pay | Admitting: Internal Medicine

## 2021-11-04 VITALS — BP 104/76 | HR 73 | Ht 68.0 in | Wt 194.0 lb

## 2021-11-04 DIAGNOSIS — I10 Essential (primary) hypertension: Secondary | ICD-10-CM | POA: Diagnosis not present

## 2021-11-04 DIAGNOSIS — E118 Type 2 diabetes mellitus with unspecified complications: Secondary | ICD-10-CM | POA: Diagnosis not present

## 2021-11-04 DIAGNOSIS — Z23 Encounter for immunization: Secondary | ICD-10-CM

## 2021-11-04 LAB — POCT GLYCOSYLATED HEMOGLOBIN (HGB A1C): Hemoglobin A1C: 7.9 % — AB (ref 4.0–5.6)

## 2021-11-04 NOTE — Progress Notes (Signed)
Date:  11/04/2021   Name:  Alicia Richard   DOB:  03-28-1975   MRN:  [id number]   Chief Complaint: Diabetes  Diabetes She presents for her follow-up diabetic visit. She has type 2 diabetes mellitus. Her disease course has been stable. Pertinent negatives for hypoglycemia include no headaches or tremors. Pertinent negatives for diabetes include no chest pain, no fatigue, no polydipsia and no polyuria. Current diabetic treatments: metformin, januvia, farxiga. She is compliant with treatment all of the time. Her weight is stable. An ACE inhibitor/angiotensin II receptor blocker is being taken.    Lab Results  Component Value Date   NA 139 05/26/2021   K 4.1 05/26/2021   CO2 23 05/26/2021   GLUCOSE 135 (H) 05/26/2021   BUN 8 05/26/2021   CREATININE 0.58 05/26/2021   CALCIUM 9.1 05/26/2021   EGFR 112 05/26/2021   GFRNONAA 109 07/03/2019   Lab Results  Component Value Date   CHOL 134 05/26/2021   HDL 40 05/26/2021   LDLCALC 73 05/26/2021   TRIG 115 05/26/2021   CHOLHDL 3.4 05/26/2021   Lab Results  Component Value Date   TSH 1.500 05/26/2021   Lab Results  Component Value Date   HGBA1C 7.5 (H) 05/26/2021   Lab Results  Component Value Date   WBC 5.9 05/26/2021   HGB 12.1 05/26/2021   HCT 35.8 05/26/2021   MCV 78 (L) 05/26/2021   PLT 255 05/26/2021   Lab Results  Component Value Date   ALT 12 05/26/2021   AST 12 05/26/2021   ALKPHOS 96 05/26/2021   BILITOT 0.3 05/26/2021   No results found for: "25OHVITD2", "25OHVITD3", "VD25OH"   Review of Systems  Constitutional:  Negative for appetite change, fatigue, fever and unexpected weight change.  HENT:  Negative for tinnitus and trouble swallowing.   Eyes:  Negative for visual disturbance.  Respiratory:  Negative for cough, chest tightness and shortness of breath.   Cardiovascular:  Negative for chest pain, palpitations and leg swelling.  Gastrointestinal:  Negative for abdominal pain.  Endocrine: Negative for  polydipsia and polyuria.  Genitourinary:  Negative for dysuria and hematuria.  Musculoskeletal:  Negative for arthralgias.  Neurological:  Negative for tremors, numbness and headaches.  Psychiatric/Behavioral:  Negative for dysphoric mood.     Patient Active Problem List   Diagnosis Date Noted   Colon cancer screening    Adenomatous polyp of ascending colon    Iron deficiency anemia 10/10/2020   Heart murmur 06/04/2020   Hyperlipidemia associated with type 2 diabetes mellitus ([place]) 10/11/2014   Essential (primary) hypertension 08/14/2014   Type II diabetes mellitus with complication ([place]) 70/62/3762    No Known Allergies  Past Surgical History:  Procedure Laterality Date   BREAST BIOPSY Right 10/10/2015   Stereotactic biopsy - benign   COLONOSCOPY WITH PROPOFOL N/A 06/22/2021   Procedure: COLONOSCOPY WITH PROPOFOL;  Surgeon: Lin Landsman, MD;  Location: [facility] ENDOSCOPY;  Service: Gastroenterology;  Laterality: N/A;   WISDOM TOOTH EXTRACTION      Social History   Tobacco Use   Smoking status: Never   Smokeless tobacco: Never  Vaping Use   Vaping Use: Never used  Substance Use Topics   Alcohol use: No    Alcohol/week: 0.0 standard drinks of alcohol   Drug use: No     Medication list has been reviewed and updated.  Current Meds  Medication Sig   atorvastatin (LIPITOR) 10 MG tablet Take 1 tablet (10 mg total) by mouth daily  at 6 PM.   FARXIGA 10 MG TABS tablet TAKE 1 TABLET(10 MG) BY MOUTH DAILY BEFORE BREAKFAST   FREESTYLE LITE test strip U UTD ONCE D   lisinopril (ZESTRIL) 20 MG tablet Take 1 tablet (20 mg total) by mouth daily.   metFORMIN (GLUCOPHAGE-XR) 500 MG 24 hr tablet TAKE 2 TABLETS(1000 MG) BY MOUTH DAILY   nystatin-triamcinolone ointment (MYCOLOG) Apply topically 2 (two) times daily.   sitaGLIPtin (JANUVIA) 100 MG tablet Take 1 tablet (100 mg total) by mouth daily.       11/04/2021    8:02 AM 05/26/2021    8:30 AM 02/17/2021    8:49 AM 10/10/2020     8:13 AM  GAD 7 : Generalized Anxiety Score  Nervous, Anxious, on Edge 0 1 1 0  Control/stop worrying 0 1 0 0  Worry too much - different things 0 0 0 0  Trouble relaxing 0 0 0 0  Restless 0 0 0 0  Easily annoyed or irritable 0 0 0 0  Afraid - awful might happen 0 0 0 0  Total GAD 7 Score 0 2 1 0  Anxiety Difficulty Not difficult at all Not difficult at all         11/04/2021    8:01 AM 05/26/2021    8:30 AM 02/17/2021    8:48 AM  Depression screen PHQ 2/9  Decreased Interest 0 0 0  Down, Depressed, Hopeless 0 1 0  PHQ - 2 Score 0 1 0  Altered sleeping 0 0 0  Tired, decreased energy 0 1 0  Change in appetite 0 1 0  Feeling bad or failure about yourself  0 0 0  Trouble concentrating 0 0 0  Moving slowly or fidgety/restless 0 0 0  Suicidal thoughts 0 0 0  PHQ-9 Score 0 3 0  Difficult doing work/chores Not difficult at all Not difficult at all Not difficult at all    BP Readings from Last 3 Encounters:  11/04/21 104/76  06/22/21 100/76  05/26/21 122/70    Physical Exam Vitals and nursing note reviewed.  Constitutional:      General: She is not in acute distress.    Appearance: She is well-developed.  HENT:     Head: Normocephalic and atraumatic.  Cardiovascular:     Rate and Rhythm: Normal rate and regular rhythm.  Pulmonary:     Effort: Pulmonary effort is normal. No respiratory distress.  Musculoskeletal:     Cervical back: Normal range of motion.     Right lower leg: No edema.     Left lower leg: No edema.  Lymphadenopathy:     Cervical: No cervical adenopathy.  Skin:    General: Skin is warm and dry.     Findings: No rash.  Neurological:     General: No focal deficit present.     Mental Status: She is alert and oriented to person, place, and time.  Psychiatric:        Mood and Affect: Mood normal.        Behavior: Behavior normal.     Wt Readings from Last 3 Encounters:  11/04/21 194 lb (88 kg)  06/22/21 198 lb (89.8 kg)  05/26/21 197 lb (89.4 kg)     BP 104/76   Pulse 73   Ht '5\' 8"'  (1.727 m)   Wt 194 lb (88 kg)   SpO2 98%   BMI 29.50 kg/m   Assessment and Plan: 1. Type II diabetes mellitus with complication (HCC)  BS not quite as good - maybe from recent cruise. Continue current regimen for now and work on diet changes Resume FSBS several times per week. - POCT glycosylated hemoglobin (Hb A1C) = 7.9 up from 7.5  2. Essential (primary) hypertension Clinically stable exam with well controlled BP. Tolerating medications without side effects at this time. Pt to continue current regimen and low sodium diet;   Partially dictated using Editor, commissioning. Any errors are unintentional.  [name], MD [facility]  11/04/2021

## 2021-12-30 ENCOUNTER — Other Ambulatory Visit: Payer: Self-pay | Admitting: Obstetrics and Gynecology

## 2021-12-30 ENCOUNTER — Encounter: Payer: Self-pay | Admitting: Obstetrics and Gynecology

## 2021-12-30 DIAGNOSIS — Z1231 Encounter for screening mammogram for malignant neoplasm of breast: Secondary | ICD-10-CM

## 2022-01-04 ENCOUNTER — Ambulatory Visit
Admission: RE | Admit: 2022-01-04 | Discharge: 2022-01-04 | Disposition: A | Discharge status: Home / Self Care | Payer: PRIVATE HEALTH INSURANCE | Source: Ambulatory Visit | Attending: Obstetrics and Gynecology | Admitting: Obstetrics and Gynecology

## 2022-01-04 DIAGNOSIS — Z1231 Encounter for screening mammogram for malignant neoplasm of breast: Secondary | ICD-10-CM | POA: Insufficient documentation

## 2022-01-21 ENCOUNTER — Other Ambulatory Visit: Payer: Self-pay | Admitting: Internal Medicine

## 2022-01-21 DIAGNOSIS — I10 Essential (primary) hypertension: Secondary | ICD-10-CM

## 2022-01-21 DIAGNOSIS — E118 Type 2 diabetes mellitus with unspecified complications: Secondary | ICD-10-CM

## 2022-01-21 DIAGNOSIS — E1169 Type 2 diabetes mellitus with other specified complication: Secondary | ICD-10-CM

## 2022-01-21 NOTE — Telephone Encounter (Signed)
Requested Prescriptions  Pending Prescriptions Disp Refills   metFORMIN (GLUCOPHAGE-XR) 500 MG 24 hr tablet [Pharmacy Med Name: METFORMIN ER 500MG 24HR TABS] 180 tablet 0    Sig: TAKE 2 TABLETS(1000 MG) BY MOUTH DAILY     Endocrinology:  Diabetes - Biguanides Failed - 01/21/2022  8:53 AM      Failed - B12 Level in normal range and within 720 days    No results found for: "VITAMINB12"       Passed - Cr in normal range and within 360 days    Creatinine, Ser  Date Value Ref Range Status  05/26/2021 0.58 0.57 - 1.00 mg/dL Final         Passed - HBA1C is between 0 and 7.9 and within 180 days    Hemoglobin A1C  Date Value Ref Range Status  11/04/2021 7.9 (A) 4.0 - 5.6 % Final   Hgb A1c MFr Bld  Date Value Ref Range Status  05/26/2021 7.5 (H) 4.8 - 5.6 % Final    Comment:             Prediabetes: 5.7 - 6.4          Diabetes: >6.4          Glycemic control for adults with diabetes: <7.0          Passed - eGFR in normal range and within 360 days    GFR calc Af Amer  Date Value Ref Range Status  07/03/2019 125 >59 mL/min/1.73 Final    Comment:    **[facility] currently reports eGFR in compliance with the current**   recommendations of the [employer]. [facility] will   update reporting as new guidelines are published from the NKF-ASN   Task force.    GFR calc non Af Amer  Date Value Ref Range Status  07/03/2019 109 >59 mL/min/1.73 Final   eGFR  Date Value Ref Range Status  05/26/2021 112 >59 mL/min/1.73 Final         Passed - Valid encounter within last 6 months    Recent Outpatient Visits           2 months ago Type II diabetes mellitus with complication [facility])   [place] [facility] at [facility], Jesse Sans, MD   8 months ago Annual physical exam   [place] [facility] and [facility] Medicine at [facility], Jesse Sans, MD   11 months ago Type II diabetes mellitus with complication [facility])   [place] Primary  Care and Sports Medicine at [facility], Jesse Sans, MD   1 year ago Type II diabetes mellitus with complication [facility]   [place] [facility] Care and Sports Medicine at [facility], Jesse Sans, MD   1 year ago Type II diabetes mellitus with complication [facility])   [place] Primary Care and Sports Medicine at [facility], Jesse Sans, MD       Future Appointments             In 1 month Army Melia Jesse Sans, MD [facility] Primary Care and Sports Medicine at [facility], [facility]   In 4 months Glean Hess, MD [facility] Primary Care and Sports Medicine at [facility] within normal limits and completed in the last 12 months    WBC  Date Value Ref Range Status  05/26/2021 5.9 3.4 - 10.8  x10E3/uL Final   RBC  Date Value Ref Range Status  05/26/2021 4.58 3.77 - 5.28 x10E6/uL Final   Hemoglobin  Date Value Ref Range Status  05/26/2021 12.1 11.1 - 15.9 g/dL Final   Hematocrit  Date Value Ref Range Status  05/26/2021 35.8 34.0 - 46.6 % Final   MCHC  Date Value Ref Range Status  05/26/2021 33.8 31.5 - 35.7 g/dL Final   [facility]  Date Value Ref Range Status  05/26/2021 26.4 (L) 26.6 - 33.0 pg Final   MCV  Date Value Ref Range Status  05/26/2021 78 (L) 79 - 97 fL Final   No results found for: "PLTCOUNTKUC", "LABPLAT", "POCPLA" RDW  Date Value Ref Range Status  05/26/2021 13.0 11.7 - 15.4 % Final          FARXIGA 10 MG TABS tablet [Pharmacy Med Name: FARXIGA 10MG TABLETS] 90 tablet 0    Sig: TAKE 1 TABLET(10 MG) BY MOUTH DAILY BEFORE BREAKFAST     Endocrinology:  Diabetes - SGLT2 Inhibitors Passed - 01/21/2022  8:53 AM      Passed - Cr in normal range and within 360 days    Creatinine, Ser  Date Value Ref Range Status  05/26/2021 0.58 0.57 - 1.00 mg/dL Final         Passed - HBA1C is between 0 and 7.9 and within 180 days    Hemoglobin A1C  Date Value Ref Range Status  11/04/2021 7.9 (A) 4.0 - 5.6  % Final   Hgb A1c MFr Bld  Date Value Ref Range Status  05/26/2021 7.5 (H) 4.8 - 5.6 % Final    Comment:             Prediabetes: 5.7 - 6.4          Diabetes: >6.4          Glycemic control for adults with diabetes: <7.0          Passed - eGFR in normal range and within 360 days    GFR calc Af Amer  Date Value Ref Range Status  07/03/2019 125 >59 mL/min/1.73 Final    Comment:    **[facility] currently reports eGFR in compliance with the current**   recommendations of the [employer]. [facility] will   update reporting as new guidelines are published from the NKF-ASN   Task force.    GFR calc non Af Amer  Date Value Ref Range Status  07/03/2019 109 >59 mL/min/1.73 Final   eGFR  Date Value Ref Range Status  05/26/2021 112 >59 mL/min/1.73 Final         Passed - Valid encounter within last 6 months    Recent Outpatient Visits           2 months ago Type II diabetes mellitus with complication [facility])   [place] [facility] at [facility], Jesse Sans, MD   8 months ago Annual physical exam   [place] Primary Care and Sports Medicine at [facility], Jesse Sans, MD   11 months ago Type II diabetes mellitus with complication [facility])   [place] Primary Care and Sports Medicine at [facility], Jesse Sans, MD   1 year ago Type II diabetes mellitus with complication [facility])   [place] Primary Care and Sports Medicine at [facility], Jesse Sans, MD   1 year ago Type II diabetes mellitus with complication [facility])   [place] Primary Care and Sports Medicine at  [facility], [facility]       Future Appointments             In 1 month Army Melia Jesse Sans, MD [facility] Primary Care and Sports Medicine at [facility]   In 4 months Glean Hess, MD [facility] Primary Care and Sports Medicine at [facility], [place]             lisinopril (ZESTRIL) 20 MG tablet  [Pharmacy Med Name: LISINOPRIL 20MG TABLETS] 90 tablet 0    Sig: TAKE 1 TABLET(20 MG) BY MOUTH DAILY     Cardiovascular:  ACE Inhibitors Failed - 01/21/2022  8:53 AM      Failed - Cr in normal range and within 180 days    Creatinine, Ser  Date Value Ref Range Status  05/26/2021 0.58 0.57 - 1.00 mg/dL Final         Failed - K in normal range and within 180 days    Potassium  Date Value Ref Range Status  05/26/2021 4.1 3.5 - 5.2 mmol/L Final         Passed - Patient is not pregnant      Passed - Last BP in normal range    BP Readings from Last 1 Encounters:  11/04/21 104/76         Passed - Valid encounter within last 6 months    Recent Outpatient Visits           2 months ago Type II diabetes mellitus with complication [facility])   [place] [facility] at [facility], Jesse Sans, MD   8 months ago Annual physical exam   [place] Primary Care and Sports Medicine at [facility], Jesse Sans, MD   11 months ago Type II diabetes mellitus with complication [facility])   [place] Primary Care and Sports Medicine at [facility], Jesse Sans, MD   1 year ago Type II diabetes mellitus with complication [facility])   [place] Primary Care and Sports Medicine at [facility], Jesse Sans, MD   1 year ago Type II diabetes mellitus with complication [facility])   [place] and Sports Medicine at [facility], Jesse Sans, MD       Future Appointments             In 1 month Army Melia Jesse Sans, MD [facility] Health Primary Care and Sports Medicine at [facility], [facility]   In 4 months Glean Hess, MD [facility] Health Primary Care and Sports Medicine at [facility], [place]             atorvastatin (LIPITOR) 10 MG tablet [Pharmacy Med Name: ATORVASTATIN 10MG TABLETS] 90 tablet 0    Sig: TAKE 1 TABLET(10 MG) BY MOUTH DAILY AT 6 PM     Cardiovascular:  Antilipid - Statins Failed - 01/21/2022  8:53 AM       Failed - Lipid Panel in normal range within the last 12 months    Cholesterol, Total  Date Value Ref Range Status  05/26/2021 134 100 - 199 mg/dL Final   LDL Chol Calc (NIH)  Date Value Ref Range Status  05/26/2021 73 0 - 99 mg/dL Final   HDL  Date Value Ref Range Status  05/26/2021 40 >39 mg/dL Final   Triglycerides  Date Value Ref Range Status  05/26/2021 115 0 - 149 mg/dL Final  Passed - Patient is not pregnant      Passed - Valid encounter within last 12 months    Recent Outpatient Visits           2 months ago Type II diabetes mellitus with complication ([place])   [place] [facility] and Sports Medicine at [facility], Jesse Sans, MD   8 months ago Annual physical exam   [place] Primary Care and Sports Medicine at [facility], Jesse Sans, MD   11 months ago Type II diabetes mellitus with complication [facility])   [place] Primary Care and Sports Medicine at [facility], Jesse Sans, MD   1 year ago Type II diabetes mellitus with complication [facility])   [place] Primary Care and Sports Medicine at [facility], Jesse Sans, MD   1 year ago Type II diabetes mellitus with complication [facility])   [place] Primary Care and Sports Medicine at [facility], Jesse Sans, MD       Future Appointments             In 1 month Army Melia, Jesse Sans, MD [facility] Primary Care and Sports Medicine at [facility], [facility]   In 4 months Army Melia, Jesse Sans, MD [place] Primary Care and Sports Medicine at [facility], [facility]

## 2022-03-09 ENCOUNTER — Ambulatory Visit: Payer: PRIVATE HEALTH INSURANCE | Admitting: Internal Medicine

## 2022-03-09 NOTE — Assessment & Plan Note (Deleted)
On Metformin, Januvia, Farxiga Last A1C 7.9 Working harder on diet and weight A1C today =

## 2022-03-09 NOTE — Progress Notes (Deleted)
Date:  03/09/2022   Name:  Alicia Richard   DOB:  28-Jun-1975   MRN:  [id number]   Chief Complaint: No chief complaint on file.  Diabetes She presents for her follow-up diabetic visit. She has type 2 diabetes mellitus. Pertinent negatives for hypoglycemia include no headaches or tremors. Pertinent negatives for diabetes include no chest pain, no fatigue, no polydipsia and no polyuria.    Lab Results  Component Value Date   NA 139 05/26/2021   K 4.1 05/26/2021   CO2 23 05/26/2021   GLUCOSE 135 (H) 05/26/2021   BUN 8 05/26/2021   CREATININE 0.58 05/26/2021   CALCIUM 9.1 05/26/2021   EGFR 112 05/26/2021   GFRNONAA 109 07/03/2019   Lab Results  Component Value Date   CHOL 134 05/26/2021   HDL 40 05/26/2021   LDLCALC 73 05/26/2021   TRIG 115 05/26/2021   CHOLHDL 3.4 05/26/2021   Lab Results  Component Value Date   TSH 1.500 05/26/2021   Lab Results  Component Value Date   HGBA1C 7.9 (A) 11/04/2021   Lab Results  Component Value Date   WBC 5.9 05/26/2021   HGB 12.1 05/26/2021   HCT 35.8 05/26/2021   MCV 78 (L) 05/26/2021   PLT 255 05/26/2021   Lab Results  Component Value Date   ALT 12 05/26/2021   AST 12 05/26/2021   ALKPHOS 96 05/26/2021   BILITOT 0.3 05/26/2021   No results found for: "25OHVITD2", "25OHVITD3", "VD25OH"   Review of Systems  Constitutional:  Negative for appetite change, fatigue, fever and unexpected weight change.  HENT:  Negative for tinnitus and trouble swallowing.   Eyes:  Negative for visual disturbance.  Respiratory:  Negative for cough, chest tightness and shortness of breath.   Cardiovascular:  Negative for chest pain, palpitations and leg swelling.  Gastrointestinal:  Negative for abdominal pain.  Endocrine: Negative for polydipsia and polyuria.  Genitourinary:  Negative for dysuria and hematuria.  Musculoskeletal:  Negative for arthralgias.  Neurological:  Negative for tremors, numbness and headaches.  Psychiatric/Behavioral:   Negative for dysphoric mood.     Patient Active Problem List   Diagnosis Date Noted   Colon cancer screening    Adenomatous polyp of ascending colon    Iron deficiency anemia 10/10/2020   Heart murmur 06/04/2020   Hyperlipidemia associated with type 2 diabetes mellitus ([place]) 10/11/2014   Essential (primary) hypertension 08/14/2014   Type II diabetes mellitus with complication ([place]) 98/92/1194    No Known Allergies  Past Surgical History:  Procedure Laterality Date   BREAST BIOPSY Right 10/10/2015   Stereotactic biopsy - benign   COLONOSCOPY WITH PROPOFOL N/A 06/22/2021   Procedure: COLONOSCOPY WITH PROPOFOL;  Surgeon: Lin Landsman, MD;  Location: [facility] ENDOSCOPY;  Service: Gastroenterology;  Laterality: N/A;   WISDOM TOOTH EXTRACTION      Social History   Tobacco Use   Smoking status: Never   Smokeless tobacco: Never  Vaping Use   Vaping Use: Never used  Substance Use Topics   Alcohol use: No    Alcohol/week: 0.0 standard drinks of alcohol   Drug use: No     Medication list has been reviewed and updated.  No outpatient medications have been marked as taking for the 03/09/22 encounter (Appointment) with Glean Hess, MD.       11/04/2021    8:02 AM 05/26/2021    8:30 AM 02/17/2021    8:49 AM 10/10/2020    8:13 AM  GAD 7 :  Generalized Anxiety Score  Nervous, Anxious, on Edge 0 1 1 0  Control/stop worrying 0 1 0 0  Worry too much - different things 0 0 0 0  Trouble relaxing 0 0 0 0  Restless 0 0 0 0  Easily annoyed or irritable 0 0 0 0  Afraid - awful might happen 0 0 0 0  Total GAD 7 Score 0 2 1 0  Anxiety Difficulty Not difficult at all Not difficult at all         11/04/2021    8:01 AM 05/26/2021    8:30 AM 02/17/2021    8:48 AM  Depression screen PHQ 2/9  Decreased Interest 0 0 0  Down, Depressed, Hopeless 0 1 0  PHQ - 2 Score 0 1 0  Altered sleeping 0 0 0  Tired, decreased energy 0 1 0  Change in appetite 0 1 0  Feeling bad or failure about  yourself  0 0 0  Trouble concentrating 0 0 0  Moving slowly or fidgety/restless 0 0 0  Suicidal thoughts 0 0 0  PHQ-9 Score 0 3 0  Difficult doing work/chores Not difficult at all Not difficult at all Not difficult at all    BP Readings from Last 3 Encounters:  11/04/21 104/76  06/22/21 100/76  05/26/21 122/70    Physical Exam Vitals and nursing note reviewed.  Constitutional:      General: She is not in acute distress.    Appearance: She is well-developed.  HENT:     Head: Normocephalic and atraumatic.  Pulmonary:     Effort: Pulmonary effort is normal. No respiratory distress.  Skin:    General: Skin is warm and dry.     Findings: No rash.  Neurological:     Mental Status: She is alert and oriented to person, place, and time.  Psychiatric:        Mood and Affect: Mood normal.        Behavior: Behavior normal.     Wt Readings from Last 3 Encounters:  11/04/21 194 lb (88 kg)  06/22/21 198 lb (89.8 kg)  05/26/21 197 lb (89.4 kg)    There were no vitals taken for this visit.  Assessment and Plan:

## 2022-03-19 ENCOUNTER — Ambulatory Visit (INDEPENDENT_AMBULATORY_CARE_PROVIDER_SITE_OTHER): Payer: PRIVATE HEALTH INSURANCE | Admitting: Internal Medicine

## 2022-03-19 ENCOUNTER — Encounter: Payer: Self-pay | Admitting: Internal Medicine

## 2022-03-19 VITALS — BP 126/86 | HR 79 | Ht 68.0 in | Wt 196.0 lb

## 2022-03-19 DIAGNOSIS — E118 Type 2 diabetes mellitus with unspecified complications: Secondary | ICD-10-CM

## 2022-03-19 LAB — HM PAP SMEAR: HM Pap smear: NORMAL

## 2022-03-19 LAB — POCT GLYCOSYLATED HEMOGLOBIN (HGB A1C): Hemoglobin A1C: 8.6 % — AB (ref 4.0–5.6)

## 2022-03-19 LAB — RESULTS CONSOLE HPV: CHL HPV: NEGATIVE

## 2022-03-19 MED ORDER — SITAGLIPTIN PHOSPHATE 100 MG PO TABS: 100.0000 mg | ORAL_TABLET | Freq: Every day | ORAL | 3 refills | Status: DC

## 2022-03-19 MED ORDER — GLIMEPIRIDE 2 MG PO TABS: 2.0000 mg | ORAL_TABLET | Freq: Every day | ORAL | 3 refills | Status: DC

## 2022-03-19 NOTE — Progress Notes (Signed)
Date:  03/19/2022   Name:  Alicia Richard   DOB:  April 10, 1975   MRN:  [id number]   Chief Complaint: Diabetes (Hasn't taken januvia in 3 weeks)  Diabetes She presents for her follow-up diabetic visit. She has type 2 diabetes mellitus. Pertinent negatives for hypoglycemia include no headaches or tremors. Pertinent negatives for diabetes include no chest pain, no fatigue, no polydipsia and no polyuria.    Lab Results  Component Value Date   NA 139 05/26/2021   K 4.1 05/26/2021   CO2 23 05/26/2021   GLUCOSE 135 (H) 05/26/2021   BUN 8 05/26/2021   CREATININE 0.58 05/26/2021   CALCIUM 9.1 05/26/2021   EGFR 112 05/26/2021   GFRNONAA 109 07/03/2019   Lab Results  Component Value Date   CHOL 134 05/26/2021   HDL 40 05/26/2021   LDLCALC 73 05/26/2021   TRIG 115 05/26/2021   CHOLHDL 3.4 05/26/2021   Lab Results  Component Value Date   TSH 1.500 05/26/2021   Lab Results  Component Value Date   HGBA1C 8.6 (A) 03/19/2022   Lab Results  Component Value Date   WBC 5.9 05/26/2021   HGB 12.1 05/26/2021   HCT 35.8 05/26/2021   MCV 78 (L) 05/26/2021   PLT 255 05/26/2021   Lab Results  Component Value Date   ALT 12 05/26/2021   AST 12 05/26/2021   ALKPHOS 96 05/26/2021   BILITOT 0.3 05/26/2021   No results found for: "25OHVITD2", "25OHVITD3", "VD25OH"   Review of Systems  Constitutional:  Negative for appetite change, fatigue, fever and unexpected weight change.  HENT:  Negative for tinnitus and trouble swallowing.   Eyes:  Negative for visual disturbance.  Respiratory:  Negative for cough, chest tightness and shortness of breath.   Cardiovascular:  Negative for chest pain, palpitations and leg swelling.  Gastrointestinal:  Negative for abdominal pain.  Endocrine: Negative for polydipsia and polyuria.  Genitourinary:  Negative for dysuria and hematuria.  Musculoskeletal:  Negative for arthralgias.  Neurological:  Negative for tremors, numbness and headaches.   Psychiatric/Behavioral:  Negative for dysphoric mood.     Patient Active Problem List   Diagnosis Date Noted   Colon cancer screening    Adenomatous polyp of ascending colon    Iron deficiency anemia 10/10/2020   Heart murmur 06/04/2020   Hyperlipidemia associated with type 2 diabetes mellitus ([place]) 10/11/2014   Essential (primary) hypertension 08/14/2014   Type II diabetes mellitus with complication ([place]) 40/98/1191    No Known Allergies  Past Surgical History:  Procedure Laterality Date   BREAST BIOPSY Right 10/10/2015   Stereotactic biopsy - benign   COLONOSCOPY WITH PROPOFOL N/A 06/22/2021   Procedure: COLONOSCOPY WITH PROPOFOL;  Surgeon: Lin Landsman, MD;  Location: [facility] ENDOSCOPY;  Service: Gastroenterology;  Laterality: N/A;   WISDOM TOOTH EXTRACTION      Social History   Tobacco Use   Smoking status: Never   Smokeless tobacco: Never  Vaping Use   Vaping Use: Never used  Substance Use Topics   Alcohol use: No    Alcohol/week: 0.0 standard drinks of alcohol   Drug use: No     Medication list has been reviewed and updated.  Current Meds  Medication Sig   atorvastatin (LIPITOR) 10 MG tablet TAKE 1 TABLET(10 MG) BY MOUTH DAILY AT 6 PM   FARXIGA 10 MG TABS tablet TAKE 1 TABLET(10 MG) BY MOUTH DAILY BEFORE BREAKFAST   FREESTYLE LITE test strip U UTD ONCE D  glimepiride (AMARYL) 2 MG tablet Take 1 tablet (2 mg total) by mouth daily before breakfast.   lisinopril (ZESTRIL) 20 MG tablet TAKE 1 TABLET(20 MG) BY MOUTH DAILY   metFORMIN (GLUCOPHAGE-XR) 500 MG 24 hr tablet TAKE 2 TABLETS(1000 MG) BY MOUTH DAILY   nystatin-triamcinolone ointment (MYCOLOG) Apply topically 2 (two) times daily.   [DISCONTINUED] sitaGLIPtin (JANUVIA) 100 MG tablet Take 1 tablet (100 mg total) by mouth daily.       03/19/2022    1:31 PM 11/04/2021    8:02 AM 05/26/2021    8:30 AM 02/17/2021    8:49 AM  GAD 7 : Generalized Anxiety Score  Nervous, Anxious, on Edge 0 0 1 1   Control/stop worrying 0 0 1 0  Worry too much - different things 0 0 0 0  Trouble relaxing 0 0 0 0  Restless 0 0 0 0  Easily annoyed or irritable 0 0 0 0  Afraid - awful might happen 0 0 0 0  Total GAD 7 Score 0 0 2 1  Anxiety Difficulty Not difficult at all Not difficult at all Not difficult at all        03/19/2022    1:31 PM 11/04/2021    8:01 AM 05/26/2021    8:30 AM  Depression screen PHQ 2/9  Decreased Interest 0 0 0  Down, Depressed, Hopeless 0 0 1  PHQ - 2 Score 0 0 1  Altered sleeping 0 0 0  Tired, decreased energy 0 0 1  Change in appetite 0 0 1  Feeling bad or failure about yourself  0 0 0  Trouble concentrating 0 0 0  Moving slowly or fidgety/restless 0 0 0  Suicidal thoughts 0 0 0  PHQ-9 Score 0 0 3  Difficult doing work/chores Not difficult at all Not difficult at all Not difficult at all    BP Readings from Last 3 Encounters:  03/19/22 126/86  11/04/21 104/76  06/22/21 100/76    Physical Exam Vitals and nursing note reviewed.  Constitutional:      General: She is not in acute distress.    Appearance: She is well-developed.  HENT:     Head: Normocephalic and atraumatic.  Pulmonary:     Effort: Pulmonary effort is normal. No respiratory distress.  Skin:    General: Skin is warm and dry.     Findings: No rash.  Neurological:     Mental Status: She is alert and oriented to person, place, and time.  Psychiatric:        Mood and Affect: Mood normal.        Behavior: Behavior normal.     Wt Readings from Last 3 Encounters:  03/19/22 196 lb (88.9 kg)  11/04/21 194 lb (88 kg)  06/22/21 198 lb (89.8 kg)    BP 126/86   Pulse 79   Ht '5\' 8"'$  (1.727 m)   Wt 196 lb (88.9 kg)   SpO2 98%   BMI 29.80 kg/m   Assessment and Plan: Problem List Items Addressed This Visit       Endocrine   Type II diabetes mellitus with complication (HCC) - Primary (Chronic)    Clinically stable without s/s of hypoglycemia on farxiga, metformin, januvia (none in 3  weeks) Tolerating medications well without side effects or other concerns. Last A1C 7.9 A1c today = 8.6 so will resume Januvia and add Glimepiride 2 mg AM Can consider combo medications to reduce pill burden in the future.  Relevant Medications   glimepiride (AMARYL) 2 MG tablet   sitaGLIPtin (JANUVIA) 100 MG tablet   Other Relevant Orders   Microalbumin / creatinine urine ratio   POCT HgB A1C (Completed)     Partially dictated using Editor, commissioning. Any errors are unintentional.  [name], MD [facility]  03/19/2022

## 2022-03-19 NOTE — Assessment & Plan Note (Addendum)
Clinically stable without s/s of hypoglycemia on farxiga, metformin, januvia (none in 3 weeks) Tolerating medications well without side effects or other concerns. Last A1C 7.9 A1c today = 8.6 so will resume Januvia and add Glimepiride 2 mg AM Can consider combo medications to reduce pill burden in the future.

## 2022-03-22 LAB — MICROALBUMIN / CREATININE URINE RATIO
Creatinine, Urine: 28.6 mg/dL
Microalb/Creat Ratio: <10 mg/g creat (ref 0–29)
Microalbumin, Urine: <3 ug/mL

## 2022-05-31 ENCOUNTER — Ambulatory Visit (INDEPENDENT_AMBULATORY_CARE_PROVIDER_SITE_OTHER): Payer: PRIVATE HEALTH INSURANCE | Admitting: Internal Medicine

## 2022-05-31 ENCOUNTER — Other Ambulatory Visit: Payer: Self-pay | Admitting: Internal Medicine

## 2022-05-31 ENCOUNTER — Encounter: Payer: Self-pay | Admitting: Internal Medicine

## 2022-05-31 VITALS — BP 118/76 | HR 70 | Ht 68.0 in | Wt 206.6 lb

## 2022-05-31 DIAGNOSIS — I10 Essential (primary) hypertension: Secondary | ICD-10-CM

## 2022-05-31 DIAGNOSIS — E1169 Type 2 diabetes mellitus with other specified complication: Secondary | ICD-10-CM | POA: Diagnosis not present

## 2022-05-31 DIAGNOSIS — E118 Type 2 diabetes mellitus with unspecified complications: Secondary | ICD-10-CM

## 2022-05-31 DIAGNOSIS — Z0000 Encounter for general adult medical examination without abnormal findings: Secondary | ICD-10-CM | POA: Diagnosis not present

## 2022-05-31 DIAGNOSIS — E785 Hyperlipidemia, unspecified: Secondary | ICD-10-CM

## 2022-05-31 DIAGNOSIS — Z1231 Encounter for screening mammogram for malignant neoplasm of breast: Secondary | ICD-10-CM

## 2022-05-31 MED ORDER — RYBELSUS 7 MG PO TABS
7.0000 mg | ORAL_TABLET | Freq: Every day | ORAL | 0 refills | Status: DC
Start: 2022-05-31 — End: 2022-07-28

## 2022-05-31 NOTE — Assessment & Plan Note (Signed)
Tolerating statin medications without concerns LDL is  Lab Results  Component Value Date   LDLCALC 73 05/26/2021  On atorvastatin 10 mg with a goal of < 70. Current dose will be adjusted if needed.

## 2022-05-31 NOTE — Progress Notes (Addendum)
Date:  05/31/2022   Name:  Alicia Richard   DOB:  04-Apr-1975   MRN:  [id number]   Chief Complaint: Annual Exam Alicia Richard is a 47 y.o. female who presents today for her Complete Annual Exam. She feels well. She reports exercising - none. She reports she is sleeping well. Breast complaints - none.  Mammogram: 12/2021 DEXA: none Pap smear: 08/2017 neg/neg GYN Colonoscopy: 06/2021 repeat 5 yrs  Health Maintenance Due  Topic Date Due   HIV Screening  Never done   DTaP/Tdap/Td (1 - Tdap) Never done   Diabetic kidney evaluation - eGFR measurement  05/27/2022    Immunization History  Administered Date(s) Administered   Influenza,inj,Quad PF,6+ Mos 11/27/2014, 10/26/2018, 11/13/2019, 11/04/2021   Influenza,inj,quad, With Preservative 12/07/2017   Influenza-Unspecified 12/06/2016, 12/11/2017, 12/23/2020   PFIZER(Purple Top)SARS-COV-2 Vaccination 05/18/2019, 06/08/2019, 03/07/2020   Pneumococcal Polysaccharide-23 09/01/2016    Hypertension This is a chronic problem. The problem is controlled. Pertinent negatives include no chest pain, headaches, palpitations or shortness of breath.  Diabetes She presents for her follow-up diabetic visit. She has type 2 diabetes mellitus. Her disease course has been improving. Pertinent negatives for hypoglycemia include no dizziness, headaches, nervousness/anxiousness or tremors. Pertinent negatives for diabetes include no chest pain, no fatigue, no polydipsia and no polyuria.  Hyperlipidemia This is a chronic problem. The problem is controlled. Pertinent negatives include no chest pain or shortness of breath. Current antihyperlipidemic treatment includes statins.    Lab Results  Component Value Date   NA 139 05/26/2021   K 4.1 05/26/2021   CO2 23 05/26/2021   GLUCOSE 135 (H) 05/26/2021   BUN 8 05/26/2021   CREATININE 0.58 05/26/2021   CALCIUM 9.1 05/26/2021   EGFR 112 05/26/2021   GFRNONAA 109 07/03/2019   Lab Results  Component Value Date    CHOL 134 05/26/2021   HDL 40 05/26/2021   LDLCALC 73 05/26/2021   TRIG 115 05/26/2021   CHOLHDL 3.4 05/26/2021   Lab Results  Component Value Date   TSH 1.500 05/26/2021   Lab Results  Component Value Date   HGBA1C 8.6 (A) 03/19/2022   Lab Results  Component Value Date   WBC 5.9 05/26/2021   HGB 12.1 05/26/2021   HCT 35.8 05/26/2021   MCV 78 (L) 05/26/2021   PLT 255 05/26/2021   Lab Results  Component Value Date   ALT 12 05/26/2021   AST 12 05/26/2021   ALKPHOS 96 05/26/2021   BILITOT 0.3 05/26/2021   No results found for: "25OHVITD2", "25OHVITD3", "VD25OH"   Review of Systems  Constitutional:  Negative for chills, fatigue and fever.  HENT:  Negative for congestion, hearing loss, tinnitus, trouble swallowing and voice change.   Eyes:  Negative for visual disturbance.  Respiratory:  Negative for cough, chest tightness, shortness of breath and wheezing.   Cardiovascular:  Negative for chest pain, palpitations and leg swelling.  Gastrointestinal:  Negative for abdominal pain, constipation, diarrhea and vomiting.  Endocrine: Negative for polydipsia and polyuria.  Genitourinary:  Negative for dysuria, frequency, genital sores, vaginal bleeding and vaginal discharge.  Musculoskeletal:  Negative for arthralgias, gait problem and joint swelling.  Skin:  Negative for color change and rash.  Neurological:  Negative for dizziness, tremors, light-headedness and headaches.  Hematological:  Negative for adenopathy. Does not bruise/bleed easily.  Psychiatric/Behavioral:  Negative for dysphoric mood and sleep disturbance. The patient is not nervous/anxious.     Patient Active Problem List   Diagnosis Date Noted   Colon  cancer screening    Adenomatous polyp of ascending colon    Iron deficiency anemia 10/10/2020   Heart murmur 06/04/2020   Hyperlipidemia associated with type 2 diabetes mellitus 10/11/2014   Essential (primary) hypertension 08/14/2014   Type II diabetes mellitus  with complication 08/14/2014    No Known Allergies  Past Surgical History:  Procedure Laterality Date   BREAST BIOPSY Right 10/10/2015   Stereotactic biopsy - benign   COLONOSCOPY WITH PROPOFOL N/A 06/22/2021   Procedure: COLONOSCOPY WITH PROPOFOL;  Surgeon: Toney ReilVanga, Rohini Reddy, MD;  Location: [facility] ENDOSCOPY;  Service: Gastroenterology;  Laterality: N/A;   WISDOM TOOTH EXTRACTION      Social History   Tobacco Use   Smoking status: Never   Smokeless tobacco: Never  Vaping Use   Vaping Use: Never used  Substance Use Topics   Alcohol use: No    Alcohol/week: 0.0 standard drinks of alcohol   Drug use: No     Medication list has been reviewed and updated.  Current Meds  Medication Sig   atorvastatin (LIPITOR) 10 MG tablet TAKE 1 TABLET(10 MG) BY MOUTH DAILY AT 6 PM   FARXIGA 10 MG TABS tablet TAKE 1 TABLET(10 MG) BY MOUTH DAILY BEFORE BREAKFAST   FREESTYLE LITE test strip U UTD ONCE D   glimepiride (AMARYL) 2 MG tablet Take 1 tablet (2 mg total) by mouth daily before breakfast.   lisinopril (ZESTRIL) 20 MG tablet TAKE 1 TABLET(20 MG) BY MOUTH DAILY   metFORMIN (GLUCOPHAGE-XR) 500 MG 24 hr tablet TAKE 2 TABLETS(1000 MG) BY MOUTH DAILY   nystatin-triamcinolone ointment (MYCOLOG) Apply topically 2 (two) times daily.   Semaglutide (RYBELSUS) 7 MG TABS Take 1 tablet (7 mg total) by mouth daily.   sitaGLIPtin (JANUVIA) 100 MG tablet Take 1 tablet (100 mg total) by mouth daily.       05/31/2022    8:10 AM 03/19/2022    1:31 PM 11/04/2021    8:02 AM 05/26/2021    8:30 AM  GAD 7 : Generalized Anxiety Score  Nervous, Anxious, on Edge 1 0 0 1  Control/stop worrying 1 0 0 1  Worry too much - different things 0 0 0 0  Trouble relaxing 0 0 0 0  Restless 0 0 0 0  Easily annoyed or irritable 0 0 0 0  Afraid - awful might happen 0 0 0 0  Total GAD 7 Score 2 0 0 2  Anxiety Difficulty Not difficult at all Not difficult at all Not difficult at all Not difficult at all       05/31/2022     8:09 AM 03/19/2022    1:31 PM 11/04/2021    8:01 AM  Depression screen PHQ 2/9  Decreased Interest 0 0 0  Down, Depressed, Hopeless 0 0 0  PHQ - 2 Score 0 0 0  Altered sleeping 1 0 0  Tired, decreased energy 1 0 0  Change in appetite 0 0 0  Feeling bad or failure about yourself  0 0 0  Trouble concentrating 0 0 0  Moving slowly or fidgety/restless 0 0 0  Suicidal thoughts 0 0 0  PHQ-9 Score 2 0 0  Difficult doing work/chores Not difficult at all Not difficult at all Not difficult at all    BP Readings from Last 3 Encounters:  05/31/22 118/76  03/19/22 126/86  11/04/21 104/76    Physical Exam Vitals and nursing note reviewed.  Constitutional:      General: She is not in  acute distress.    Appearance: She is well-developed.  HENT:     Head: Normocephalic and atraumatic.     Right Ear: Tympanic membrane and ear canal normal.     Left Ear: Tympanic membrane and ear canal normal.     Nose:     Right Sinus: No maxillary sinus tenderness.     Left Sinus: No maxillary sinus tenderness.  Eyes:     General: No scleral icterus.       Right eye: No discharge.        Left eye: No discharge.     Conjunctiva/sclera: Conjunctivae normal.  Neck:     Thyroid: No thyromegaly.     Vascular: No carotid bruit.  Cardiovascular:     Rate and Rhythm: Normal rate and regular rhythm.     Pulses: Normal pulses.     Heart sounds: Normal heart sounds.  Pulmonary:     Effort: Pulmonary effort is normal. No respiratory distress.     Breath sounds: No wheezing.  Abdominal:     General: Bowel sounds are normal.     Palpations: Abdomen is soft.     Tenderness: There is no abdominal tenderness.  Musculoskeletal:     Cervical back: Normal range of motion. No erythema.     Right lower leg: No edema.     Left lower leg: No edema.  Lymphadenopathy:     Cervical: No cervical adenopathy.  Skin:    General: Skin is warm and dry.     Findings: No rash.  Neurological:     Mental Status: She is  alert and oriented to person, place, and time.     Cranial Nerves: No cranial nerve deficit.     Sensory: No sensory deficit.     Deep Tendon Reflexes: Reflexes are normal and symmetric.  Psychiatric:        Attention and Perception: Attention normal.        Mood and Affect: Mood normal.    Diabetic Foot Exam - Simple   Simple Foot Form Diabetic Foot exam was performed with the following findings: Yes 05/31/2022  8:13 AM  Visual Inspection No deformities, no ulcerations, no other skin breakdown bilaterally: Yes Sensation Testing Intact to touch and monofilament testing bilaterally: Yes Pulse Check Posterior Tibialis and Dorsalis pulse intact bilaterally: Yes Comments      Wt Readings from Last 3 Encounters:  05/31/22 206 lb 9.6 oz (93.7 kg)  03/19/22 196 lb (88.9 kg)  11/04/21 194 lb (88 kg)    BP 118/76   Pulse 70   Ht 5\' 8"  (1.727 m)   Wt 206 lb 9.6 oz (93.7 kg)   SpO2 98%   BMI 31.41 kg/m   Assessment and Plan:  Problem List Items Addressed This Visit       Cardiovascular and Mediastinum   Essential (primary) hypertension (Chronic)    Clinically stable exam with well controlled BP on lisinopril. Tolerating medications without side effects. Pt to continue current regimen and low sodium diet.       Relevant Orders   CBC with Differential/Platelet   TSH     Endocrine   Hyperlipidemia associated with type 2 diabetes mellitus (Chronic)    Tolerating statin medications without concerns LDL is  Lab Results  Component Value Date   LDLCALC 73 05/26/2021  On atorvastatin 10 mg with a goal of < 70. Current dose will be adjusted if needed.       Relevant Medications   Semaglutide (  RYBELSUS) 7 MG TABS   Other Relevant Orders   Lipid panel   Type II diabetes mellitus with complication (Chronic)    Clinically stable without s/s of hypoglycemia. Tolerating metformin, farxiga, glimepiride and januvia well without side effects or other concerns.  Januvia  resumed and glimepiride added last visit due to high A1C.  However, glucoses are still high. Lab Results  Component Value Date   HGBA1C 8.6 (A) 03/19/2022  Will hold Januvia Samples of Rybelsus given - Rx for 7 mg sent in F/u 3 mo       Relevant Medications   Semaglutide (RYBELSUS) 7 MG TABS   Other Relevant Orders   Comprehensive metabolic panel   Hemoglobin A1c   Other Visit Diagnoses     Annual physical exam    -  Primary   Relevant Orders   CBC with Differential/Platelet   Comprehensive metabolic panel   Hemoglobin A1c   Encounter for screening mammogram for breast cancer       ordered by GYN       Return in about 3 months (around 08/30/2022) for DM.   Partially dictated using Dragon software, any errors are not intentional.  Reubin Milan, MD [facility] [place], [place]

## 2022-05-31 NOTE — Assessment & Plan Note (Addendum)
Clinically stable without s/s of hypoglycemia. Tolerating metformin, farxiga, glimepiride and januvia well without side effects or other concerns.  Januvia resumed and glimepiride added last visit due to high A1C.  However, glucoses are still high. Lab Results  Component Value Date   HGBA1C 8.6 (A) 03/19/2022  Will hold Januvia Samples of Rybelsus given - Rx for 7 mg sent in F/u 3 mo

## 2022-05-31 NOTE — Patient Instructions (Signed)
Hold Januvia

## 2022-05-31 NOTE — Assessment & Plan Note (Signed)
Clinically stable exam with well controlled BP on lisinopril. Tolerating medications without side effects. Pt to continue current regimen and low sodium diet.  

## 2022-06-01 LAB — CBC WITH DIFFERENTIAL/PLATELET
Basophils Absolute: 0.1 10*3/uL (ref 0.0–0.2)
Basos: 1 %
EOS (ABSOLUTE): 0.1 10*3/uL (ref 0.0–0.4)
Eos: 2 %
Hematocrit: 38.7 % (ref 34.0–46.6)
Hemoglobin: 12.9 g/dL (ref 11.1–15.9)
Immature Grans (Abs): 0 10*3/uL (ref 0.0–0.1)
Immature Granulocytes: 0 %
Lymphocytes Absolute: 2 10*3/uL (ref 0.7–3.1)
Lymphs: 31 %
MCH: 27 pg (ref 26.6–33.0)
MCHC: 33.3 g/dL (ref 31.5–35.7)
MCV: 81 fL (ref 79–97)
Monocytes Absolute: 0.5 10*3/uL (ref 0.1–0.9)
Monocytes: 8 %
Neutrophils Absolute: 3.7 10*3/uL (ref 1.4–7.0)
Neutrophils: 58 %
Platelets: 234 10*3/uL (ref 150–450)
RBC: 4.78 x10E6/uL (ref 3.77–5.28)
RDW: 13.2 % (ref 11.7–15.4)
WBC: 6.4 10*3/uL (ref 3.4–10.8)

## 2022-06-01 LAB — COMPREHENSIVE METABOLIC PANEL
ALT: 30 IU/L (ref 0–32)
AST: 14 IU/L (ref 0–40)
Albumin/Globulin Ratio: 2.5 — ABNORMAL HIGH (ref 1.2–2.2)
Albumin: 4.3 g/dL (ref 3.9–4.9)
Alkaline Phosphatase: 114 IU/L (ref 44–121)
BUN/Creatinine Ratio: 17 (ref 9–23)
BUN: 10 mg/dL (ref 6–24)
Bilirubin Total: 0.4 mg/dL (ref 0.0–1.2)
CO2: 18 mmol/L — ABNORMAL LOW (ref 20–29)
Calcium: 9.9 mg/dL (ref 8.7–10.2)
Chloride: 104 mmol/L (ref 96–106)
Creatinine, Ser: 0.6 mg/dL (ref 0.57–1.00)
Globulin, Total: 1.7 g/dL (ref 1.5–4.5)
Glucose: 192 mg/dL — ABNORMAL HIGH (ref 70–99)
Potassium: 4.4 mmol/L (ref 3.5–5.2)
Sodium: 142 mmol/L (ref 134–144)
Total Protein: 6 g/dL (ref 6.0–8.5)
eGFR: 111 mL/min/{1.73_m2} (ref >59)

## 2022-06-01 LAB — HEMOGLOBIN A1C
Est. average glucose Bld gHb Est-mCnc: 189 mg/dL
Hgb A1c MFr Bld: 8.2 % — ABNORMAL HIGH (ref 4.8–5.6)

## 2022-06-01 LAB — LIPID PANEL
Chol/HDL Ratio: 4 ratio (ref 0.0–4.4)
Cholesterol, Total: 171 mg/dL (ref 100–199)
HDL: 43 mg/dL (ref >39)
LDL Chol Calc (NIH): 95 mg/dL (ref 0–99)
Triglycerides: 190 mg/dL — ABNORMAL HIGH (ref 0–149)
VLDL Cholesterol Cal: 33 mg/dL (ref 5–40)

## 2022-06-01 LAB — TSH: TSH: 3.06 u[IU]/mL (ref 0.450–4.500)

## 2022-06-11 LAB — HM DIABETES EYE EXAM

## 2022-06-15 ENCOUNTER — Encounter: Payer: Self-pay | Admitting: Internal Medicine

## 2022-07-28 ENCOUNTER — Other Ambulatory Visit: Payer: Self-pay | Admitting: Internal Medicine

## 2022-07-28 ENCOUNTER — Telehealth: Payer: Self-pay | Admitting: Internal Medicine

## 2022-07-28 DIAGNOSIS — E118 Type 2 diabetes mellitus with unspecified complications: Secondary | ICD-10-CM

## 2022-07-28 MED ORDER — RYBELSUS 14 MG PO TABS
14.0000 mg | ORAL_TABLET | Freq: Every day | ORAL | 1 refills | Status: DC
Start: 2022-07-28 — End: 2023-03-14

## 2022-07-28 NOTE — Telephone Encounter (Signed)
Patient informed this was sent to pharmacy.  - Alicia Richard

## 2022-07-28 NOTE — Telephone Encounter (Signed)
Medication Refill - Medication:  Semaglutide (RYBELSUS) 7 MG TABS  Pt states her PCP advised her to call when it is time for her third does increase to have the medication called into the pharmacy.   Has the patient contacted their pharmacy? No.  Preferred Pharmacy (with phone number or street name): [id number] - [place], [place] - [address] AT [facility] OF [address] [facility]  Phone: [phone] Fax: [phone]  Has the patient been seen for an appointment in the last year OR does the patient have an upcoming appointment? Yes.    Agent: Please be advised that RX refills may take up to 3 business days. We ask that you follow-up with your pharmacy.

## 2022-07-28 NOTE — Telephone Encounter (Signed)
Please review.  KP

## 2022-07-28 NOTE — Telephone Encounter (Signed)
Patient is requesting Rx review for dose increase- will need new Rx

## 2022-09-02 ENCOUNTER — Ambulatory Visit (INDEPENDENT_AMBULATORY_CARE_PROVIDER_SITE_OTHER): Payer: PRIVATE HEALTH INSURANCE | Admitting: Internal Medicine

## 2022-09-02 ENCOUNTER — Encounter: Payer: Self-pay | Admitting: Internal Medicine

## 2022-09-02 VITALS — BP 102/64 | HR 73 | Ht 68.0 in | Wt 203.4 lb

## 2022-09-02 DIAGNOSIS — E118 Type 2 diabetes mellitus with unspecified complications: Secondary | ICD-10-CM | POA: Diagnosis not present

## 2022-09-02 DIAGNOSIS — Z7984 Long term (current) use of oral hypoglycemic drugs: Secondary | ICD-10-CM | POA: Diagnosis not present

## 2022-09-02 LAB — POCT GLYCOSYLATED HEMOGLOBIN (HGB A1C): Hemoglobin A1C: 7.5 % — AB (ref 4.0–5.6)

## 2022-09-02 NOTE — Progress Notes (Signed)
Date:  09/02/2022   Name:  Alicia Richard   DOB:  1975/06/21   MRN:  [id number]   Chief Complaint: Diabetes  Diabetes She presents for her follow-up diabetic visit. She has type 2 diabetes mellitus. Pertinent negatives for hypoglycemia include no headaches or tremors. Pertinent negatives for diabetes include no chest pain, no fatigue, no polydipsia and no polyuria. Current diabetic treatments: Farxiga, metformin, glimepiride and Rybelsus. She is compliant with treatment all of the time. Her home blood glucose trend is decreasing steadily.    Lab Results  Component Value Date   NA 142 05/31/2022   K 4.4 05/31/2022   CO2 18 (L) 05/31/2022   GLUCOSE 192 (H) 05/31/2022   BUN 10 05/31/2022   CREATININE 0.60 05/31/2022   CALCIUM 9.9 05/31/2022   EGFR 111 05/31/2022   GFRNONAA 109 07/03/2019   Lab Results  Component Value Date   CHOL 171 05/31/2022   HDL 43 05/31/2022   LDLCALC 95 05/31/2022   TRIG 190 (H) 05/31/2022   CHOLHDL 4.0 05/31/2022   Lab Results  Component Value Date   TSH 3.060 05/31/2022   Lab Results  Component Value Date   HGBA1C 7.5 (A) 09/02/2022   Lab Results  Component Value Date   WBC 6.4 05/31/2022   HGB 12.9 05/31/2022   HCT 38.7 05/31/2022   MCV 81 05/31/2022   PLT 234 05/31/2022   Lab Results  Component Value Date   ALT 30 05/31/2022   AST 14 05/31/2022   ALKPHOS 114 05/31/2022   BILITOT 0.4 05/31/2022   No results found for: "25OHVITD2", "25OHVITD3", "VD25OH"   Review of Systems  Constitutional:  Negative for appetite change, fatigue, fever and unexpected weight change.  HENT:  Negative for tinnitus and trouble swallowing.   Eyes:  Negative for visual disturbance.  Respiratory:  Negative for cough, chest tightness and shortness of breath.   Cardiovascular:  Negative for chest pain, palpitations and leg swelling.  Gastrointestinal:  Negative for abdominal pain.  Endocrine: Negative for polydipsia and polyuria.  Genitourinary:  Negative  for dysuria and hematuria.  Musculoskeletal:  Negative for arthralgias.  Neurological:  Negative for tremors, numbness and headaches.  Psychiatric/Behavioral:  Negative for dysphoric mood.     Patient Active Problem List   Diagnosis Date Noted   Colon cancer screening    Adenomatous polyp of ascending colon    Iron deficiency anemia 10/10/2020   Heart murmur 06/04/2020   Hyperlipidemia associated with type 2 diabetes mellitus (HCC) 10/11/2014   Essential (primary) hypertension 08/14/2014   Type II diabetes mellitus with complication (HCC) 08/14/2014    No Known Allergies  Past Surgical History:  Procedure Laterality Date   BREAST BIOPSY Right 10/10/2015   Stereotactic biopsy - benign   COLONOSCOPY WITH PROPOFOL N/A 06/22/2021   Procedure: COLONOSCOPY WITH PROPOFOL;  Surgeon: Toney Reil, MD;  Location: [facility] ENDOSCOPY;  Service: Gastroenterology;  Laterality: N/A;   WISDOM TOOTH EXTRACTION      Social History   Tobacco Use   Smoking status: Never   Smokeless tobacco: Never  Vaping Use   Vaping status: Never Used  Substance Use Topics   Alcohol use: No    Alcohol/week: 0.0 standard drinks of alcohol   Drug use: No     Medication list has been reviewed and updated.  Current Meds  Medication Sig   atorvastatin (LIPITOR) 10 MG tablet TAKE 1 TABLET(10 MG) BY MOUTH DAILY AT 6 PM   dapagliflozin propanediol (FARXIGA) 10 MG  TABS tablet TAKE 1 TABLET(10 MG) BY MOUTH DAILY BEFORE BREAKFAST   FREESTYLE LITE test strip U UTD ONCE D   glimepiride (AMARYL) 2 MG tablet Take 1 tablet (2 mg total) by mouth daily before breakfast.   lisinopril (ZESTRIL) 20 MG tablet TAKE 1 TABLET(20 MG) BY MOUTH DAILY   metFORMIN (GLUCOPHAGE-XR) 500 MG 24 hr tablet TAKE 2 TABLETS(1000 MG) BY MOUTH DAILY   nystatin-triamcinolone ointment (MYCOLOG) Apply topically 2 (two) times daily.   Semaglutide (RYBELSUS) 14 MG TABS Take 1 tablet (14 mg total) by mouth daily.       09/02/2022   10:36 AM  05/31/2022    8:10 AM 03/19/2022    1:31 PM 11/04/2021    8:02 AM  GAD 7 : Generalized Anxiety Score  Nervous, Anxious, on Edge 0 1 0 0  Control/stop worrying 0 1 0 0  Worry too much - different things 0 0 0 0  Trouble relaxing 0 0 0 0  Restless 0 0 0 0  Easily annoyed or irritable 0 0 0 0  Afraid - awful might happen 0 0 0 0  Total GAD 7 Score 0 2 0 0  Anxiety Difficulty Not difficult at all Not difficult at all Not difficult at all Not difficult at all       09/02/2022   10:36 AM 05/31/2022    8:09 AM 03/19/2022    1:31 PM  Depression screen PHQ 2/9  Decreased Interest 0 0 0  Down, Depressed, Hopeless 0 0 0  PHQ - 2 Score 0 0 0  Altered sleeping 0 1 0  Tired, decreased energy 0 1 0  Change in appetite 0 0 0  Feeling bad or failure about yourself  0 0 0  Trouble concentrating 0 0 0  Moving slowly or fidgety/restless 0 0 0  Suicidal thoughts 0 0 0  PHQ-9 Score 0 2 0  Difficult doing work/chores Not difficult at all Not difficult at all Not difficult at all    BP Readings from Last 3 Encounters:  09/02/22 102/64  05/31/22 118/76  03/19/22 126/86    Physical Exam Vitals and nursing note reviewed.  Constitutional:      General: She is not in acute distress.    Appearance: She is well-developed.  HENT:     Head: Normocephalic and atraumatic.  Cardiovascular:     Rate and Rhythm: Normal rate and regular rhythm.     Heart sounds: No murmur heard. Pulmonary:     Effort: Pulmonary effort is normal. No respiratory distress.     Breath sounds: No wheezing or rhonchi.  Musculoskeletal:     Right lower leg: No edema.     Left lower leg: No edema.  Skin:    General: Skin is warm and dry.     Findings: No rash.  Neurological:     General: No focal deficit present.     Mental Status: She is alert and oriented to person, place, and time.  Psychiatric:        Mood and Affect: Mood normal.        Behavior: Behavior normal.     Wt Readings from Last 3 Encounters:  09/02/22  203 lb 6.4 oz (92.3 kg)  05/31/22 206 lb 9.6 oz (93.7 kg)  03/19/22 196 lb (88.9 kg)    BP 102/64   Pulse 73   Ht 5\' 8"  (1.727 m)   Wt 203 lb 6.4 oz (92.3 kg)   SpO2 97%  BMI 30.93 kg/m   Assessment and Plan:  Problem List Items Addressed This Visit     Type II diabetes mellitus with complication (HCC) - Primary (Chronic)    Blood sugars stable without hypoglycemic symptoms or events. Currently being treated with Marcelline Deist, amaryl and metformin.  Rybelsus added last visit to help lower A1C and aid with weight loss. Weight is down 3 lbs and blood sugars are improving. Lab Results  Component Value Date   HGBA1C 8.2 (H) 05/31/2022  A1c today = 7.5 much improved.  Continue current medications.       Relevant Orders   POCT glycosylated hemoglobin (Hb A1C) (Completed)    Return in about 4 months (around 01/03/2023) for DM, HTN.   Partially dictated using Dragon software, any errors are not intentional.  Reubin Milan, MD [facility] [place], [place]

## 2022-09-02 NOTE — Assessment & Plan Note (Addendum)
Blood sugars stable without hypoglycemic symptoms or events. Currently being treated with Marcelline Deist, amaryl and metformin.  Rybelsus added last visit to help lower A1C and aid with weight loss. Weight is down 3 lbs and blood sugars are improving. Lab Results  Component Value Date   HGBA1C 8.2 (H) 05/31/2022  A1c today = 7.5 much improved.  Continue current medications.

## 2022-12-28 ENCOUNTER — Other Ambulatory Visit: Payer: Self-pay | Admitting: Obstetrics and Gynecology

## 2022-12-28 DIAGNOSIS — Z1231 Encounter for screening mammogram for malignant neoplasm of breast: Secondary | ICD-10-CM

## 2023-01-04 ENCOUNTER — Ambulatory Visit: Payer: PRIVATE HEALTH INSURANCE | Admitting: Internal Medicine

## 2023-01-11 ENCOUNTER — Ambulatory Visit
Admission: RE | Admit: 2023-01-11 | Discharge: 2023-01-11 | Disposition: A | Discharge status: Home / Self Care | Payer: PRIVATE HEALTH INSURANCE | Source: Ambulatory Visit | Attending: Obstetrics and Gynecology | Admitting: Obstetrics and Gynecology

## 2023-01-11 DIAGNOSIS — Z1231 Encounter for screening mammogram for malignant neoplasm of breast: Secondary | ICD-10-CM | POA: Diagnosis present

## 2023-01-17 ENCOUNTER — Encounter: Payer: Self-pay | Admitting: Obstetrics and Gynecology

## 2023-01-26 ENCOUNTER — Other Ambulatory Visit: Payer: Self-pay | Admitting: Obstetrics and Gynecology

## 2023-01-26 ENCOUNTER — Encounter: Payer: Self-pay | Admitting: Internal Medicine

## 2023-01-26 ENCOUNTER — Ambulatory Visit (INDEPENDENT_AMBULATORY_CARE_PROVIDER_SITE_OTHER): Payer: PRIVATE HEALTH INSURANCE | Admitting: Internal Medicine

## 2023-01-26 VITALS — BP 118/82 | HR 77 | Ht 68.0 in | Wt 189.0 lb

## 2023-01-26 DIAGNOSIS — J029 Acute pharyngitis, unspecified: Secondary | ICD-10-CM

## 2023-01-26 DIAGNOSIS — Z7984 Long term (current) use of oral hypoglycemic drugs: Secondary | ICD-10-CM

## 2023-01-26 DIAGNOSIS — Z23 Encounter for immunization: Secondary | ICD-10-CM | POA: Diagnosis not present

## 2023-01-26 DIAGNOSIS — E118 Type 2 diabetes mellitus with unspecified complications: Secondary | ICD-10-CM

## 2023-01-26 DIAGNOSIS — R928 Other abnormal and inconclusive findings on diagnostic imaging of breast: Secondary | ICD-10-CM

## 2023-01-26 LAB — POCT GLYCOSYLATED HEMOGLOBIN (HGB A1C): Hemoglobin A1C: 7.2 % — AB (ref 4.0–5.6)

## 2023-01-26 NOTE — Assessment & Plan Note (Addendum)
Blood sugars stable without hypoglycemic symptoms or events. Currently managed with farxiga, Rybelsus, MTF and glipizide. Changes made last visit are none. She has lost 14 lbs since last visit. Lab Results  Component Value Date   HGBA1C 7.5 (A) 09/02/2022  A1C today = 7.2.  continue current regimen.

## 2023-01-26 NOTE — Progress Notes (Signed)
Date:  01/26/2023   Name:  Alicia Richard   DOB:  11/23/1975   MRN:  [id number]   Chief Complaint: Hypertension and Diabetes  Diabetes She presents for her follow-up diabetic visit. She has type 2 diabetes mellitus. Her disease course has been improving. Pertinent negatives for hypoglycemia include no headaches or tremors. Pertinent negatives for diabetes include no chest pain, no fatigue, no polydipsia and no polyuria. Her weight is decreasing steadily (has lost 14 lbs since last visit).  Hypertension This is a chronic problem. The problem is controlled. Pertinent negatives include no chest pain, headaches, palpitations or shortness of breath. Past treatments include ACE inhibitors.  Sore Throat  This is a new problem. The current episode started today. The problem has been unchanged. Neither side of throat is experiencing more pain than the other. There has been no fever. The pain is mild. Pertinent negatives include no abdominal pain, coughing, headaches, hoarse voice, shortness of breath, swollen glands or trouble swallowing. She has had no exposure to strep or mono. She has tried nothing for the symptoms.    Review of Systems  Constitutional:  Negative for appetite change, fatigue, fever and unexpected weight change.  HENT:  Positive for sore throat. Negative for hoarse voice, postnasal drip, sinus pressure, tinnitus and trouble swallowing.   Eyes:  Negative for visual disturbance.  Respiratory:  Negative for cough, chest tightness and shortness of breath.   Cardiovascular:  Negative for chest pain, palpitations and leg swelling.  Gastrointestinal:  Negative for abdominal pain.  Endocrine: Negative for polydipsia and polyuria.  Genitourinary:  Negative for dysuria and hematuria.  Musculoskeletal:  Negative for arthralgias.  Neurological:  Negative for tremors, numbness and headaches.  Psychiatric/Behavioral:  Negative for dysphoric mood.      Lab Results  Component Value Date    NA 142 05/31/2022   K 4.4 05/31/2022   CO2 18 (L) 05/31/2022   GLUCOSE 192 (H) 05/31/2022   BUN 10 05/31/2022   CREATININE 0.60 05/31/2022   CALCIUM 9.9 05/31/2022   EGFR 111 05/31/2022   GFRNONAA 109 07/03/2019   Lab Results  Component Value Date   CHOL 171 05/31/2022   HDL 43 05/31/2022   LDLCALC 95 05/31/2022   TRIG 190 (H) 05/31/2022   CHOLHDL 4.0 05/31/2022   Lab Results  Component Value Date   TSH 3.060 05/31/2022   Lab Results  Component Value Date   HGBA1C 7.2 (A) 01/26/2023   Lab Results  Component Value Date   WBC 6.4 05/31/2022   HGB 12.9 05/31/2022   HCT 38.7 05/31/2022   MCV 81 05/31/2022   PLT 234 05/31/2022   Lab Results  Component Value Date   ALT 30 05/31/2022   AST 14 05/31/2022   ALKPHOS 114 05/31/2022   BILITOT 0.4 05/31/2022   No results found for: "25OHVITD2", "25OHVITD3", "VD25OH"   Patient Active Problem List   Diagnosis Date Noted   Colon cancer screening    Adenomatous polyp of ascending colon    Iron deficiency anemia 10/10/2020   Heart murmur 06/04/2020   Hyperlipidemia associated with type 2 diabetes mellitus (HCC) 10/11/2014   Essential (primary) hypertension 08/14/2014   Type II diabetes mellitus with complication (HCC) 08/14/2014    No Known Allergies  Past Surgical History:  Procedure Laterality Date   BREAST BIOPSY Right 10/10/2015   Stereotactic biopsy - benign   COLONOSCOPY WITH PROPOFOL N/A 06/22/2021   Procedure: COLONOSCOPY WITH PROPOFOL;  Surgeon: Toney Reil, MD;  Location:  [facility] ENDOSCOPY;  Service: Gastroenterology;  Laterality: N/A;   WISDOM TOOTH EXTRACTION      Social History   Tobacco Use   Smoking status: Never   Smokeless tobacco: Never  Vaping Use   Vaping status: Never Used  Substance Use Topics   Alcohol use: No    Alcohol/week: 0.0 standard drinks of alcohol   Drug use: No     Medication list has been reviewed and updated.  Current Meds  Medication Sig   atorvastatin (LIPITOR)  10 MG tablet TAKE 1 TABLET(10 MG) BY MOUTH DAILY AT 6 PM   dapagliflozin propanediol (FARXIGA) 10 MG TABS tablet TAKE 1 TABLET(10 MG) BY MOUTH DAILY BEFORE BREAKFAST   FREESTYLE LITE test strip U UTD ONCE D   glimepiride (AMARYL) 2 MG tablet Take 1 tablet (2 mg total) by mouth daily before breakfast.   lisinopril (ZESTRIL) 20 MG tablet TAKE 1 TABLET(20 MG) BY MOUTH DAILY   metFORMIN (GLUCOPHAGE-XR) 500 MG 24 hr tablet TAKE 2 TABLETS(1000 MG) BY MOUTH DAILY   nystatin-triamcinolone ointment (MYCOLOG) Apply topically 2 (two) times daily.   Semaglutide (RYBELSUS) 14 MG TABS Take 1 tablet (14 mg total) by mouth daily.       01/26/2023    9:49 AM 09/02/2022   10:36 AM 05/31/2022    8:10 AM 03/19/2022    1:31 PM  GAD 7 : Generalized Anxiety Score  Nervous, Anxious, on Edge 0 0 1 0  Control/stop worrying 0 0 1 0  Worry too much - different things 0 0 0 0  Trouble relaxing 0 0 0 0  Restless 0 0 0 0  Easily annoyed or irritable 0 0 0 0  Afraid - awful might happen 0 0 0 0  Total GAD 7 Score 0 0 2 0  Anxiety Difficulty Not difficult at all Not difficult at all Not difficult at all Not difficult at all       01/26/2023    9:49 AM 09/02/2022   10:36 AM 05/31/2022    8:09 AM  Depression screen PHQ 2/9  Decreased Interest 0 0 0  Down, Depressed, Hopeless 0 0 0  PHQ - 2 Score 0 0 0  Altered sleeping 0 0 1  Tired, decreased energy 0 0 1  Change in appetite 0 0 0  Feeling bad or failure about yourself  0 0 0  Trouble concentrating 0 0 0  Moving slowly or fidgety/restless 0 0 0  Suicidal thoughts 0 0 0  PHQ-9 Score 0 0 2  Difficult doing work/chores Not difficult at all Not difficult at all Not difficult at all    BP Readings from Last 3 Encounters:  01/26/23 118/82  09/02/22 102/64  05/31/22 118/76    Physical Exam Vitals and nursing note reviewed.  Constitutional:      General: She is not in acute distress.    Appearance: Normal appearance. She is well-developed.  HENT:     Head:  Normocephalic and atraumatic.     Mouth/Throat:     Pharynx: Posterior oropharyngeal erythema present. No oropharyngeal exudate or uvula swelling.  Cardiovascular:     Rate and Rhythm: Normal rate and regular rhythm.     Heart sounds: No murmur heard. Pulmonary:     Effort: Pulmonary effort is normal. No respiratory distress.     Breath sounds: No wheezing or rhonchi.  Musculoskeletal:     Cervical back: Normal range of motion.     Right lower leg: No edema.  Left lower leg: No edema.  Lymphadenopathy:     Cervical: No cervical adenopathy.  Skin:    General: Skin is warm and dry.     Findings: No rash.  Neurological:     Mental Status: She is alert and oriented to person, place, and time.  Psychiatric:        Mood and Affect: Mood normal.        Behavior: Behavior normal.     Wt Readings from Last 3 Encounters:  01/26/23 189 lb (85.7 kg)  09/02/22 203 lb 6.4 oz (92.3 kg)  05/31/22 206 lb 9.6 oz (93.7 kg)    BP 118/82   Pulse 77   Ht 5\' 8"  (1.727 m)   Wt 189 lb (85.7 kg)   SpO2 99%   BMI 28.74 kg/m   Assessment and Plan:  Problem List Items Addressed This Visit       Unprioritized   Type II diabetes mellitus with complication (HCC) - Primary (Chronic)    Blood sugars stable without hypoglycemic symptoms or events. Currently managed with farxiga, Rybelsus, MTF and glipizide. Changes made last visit are none. She has lost 14 lbs since last visit. Lab Results  Component Value Date   HGBA1C 7.5 (A) 09/02/2022  A1C today = 7.2.  continue current regimen.       Relevant Orders   POCT glycosylated hemoglobin (Hb A1C) (Completed)   Microalbumin / creatinine urine ratio   Other Visit Diagnoses     Need for influenza vaccination       Relevant Orders   Flu vaccine trivalent PF, 6mos and older(Flulaval,Afluria,Fluarix,Fluzone) (Completed)   Long term current use of oral hypoglycemic drug       Pharyngitis, unspecified etiology       likely viral or due to  PND recommend Tylenol qid/lozenges call for antibiotics to cover strep if worsening       Return in about 4 months (around 05/27/2023) for CPX.    Reubin Milan, MD [facility] [place]

## 2023-01-27 LAB — MICROALBUMIN / CREATININE URINE RATIO
Creatinine, Urine: 11.7 mg/dL
Microalb/Creat Ratio: <26 mg/g{creat} (ref 0–29)
Microalbumin, Urine: <3 ug/mL

## 2023-01-28 ENCOUNTER — Ambulatory Visit
Admission: RE | Admit: 2023-01-28 | Discharge: 2023-01-28 | Disposition: A | Discharge status: Home / Self Care | Payer: PRIVATE HEALTH INSURANCE | Source: Ambulatory Visit | Attending: Obstetrics and Gynecology | Admitting: Obstetrics and Gynecology

## 2023-01-28 DIAGNOSIS — R928 Other abnormal and inconclusive findings on diagnostic imaging of breast: Secondary | ICD-10-CM | POA: Diagnosis present

## 2023-03-13 ENCOUNTER — Other Ambulatory Visit: Payer: Self-pay | Admitting: Internal Medicine

## 2023-03-13 DIAGNOSIS — E118 Type 2 diabetes mellitus with unspecified complications: Secondary | ICD-10-CM

## 2023-03-14 NOTE — Telephone Encounter (Signed)
Requested medication (s) are due for refill today: Yes  Requested medication (s) are on the active medication list: Yes  Last refill:  07/28/22  Future visit scheduled: Yes  Notes to clinic:  Manual review.    Requested Prescriptions  Pending Prescriptions Disp Refills   RYBELSUS 14 MG TABS [Pharmacy Med Name: RYBELSUS 14MG  TABLETS] 90 tablet 1    Sig: TAKE 1 TABLET(14 MG) BY MOUTH DAILY     Off-Protocol Failed - 03/14/2023  1:55 PM      Failed - Medication not assigned to a protocol, review manually.      Passed - Valid encounter within last 12 months    Recent Outpatient Visits           1 month ago Type II diabetes mellitus with complication [facility])   [place] [facility] at [facility], Nyoka Cowden, MD   6 months ago Type II diabetes mellitus with complication [facility])   [place] [facility] at [facility], Nyoka Cowden, MD   9 months ago Annual physical exam   [facility] at [facility], Nyoka Cowden, MD   12 months ago Type II diabetes mellitus with complication [facility])   [place] [facility] Medicine at [facility], Nyoka Cowden, MD   1 year ago Type II diabetes mellitus with complication [facility])    Primary Care & Sports Medicine at [facility], Nyoka Cowden, MD       Future Appointments             In 2 months Judithann Graves, Nyoka Cowden, MD [facility] at [facility], [facility]

## 2023-05-31 ENCOUNTER — Encounter: Payer: Self-pay | Admitting: Internal Medicine

## 2023-06-02 ENCOUNTER — Encounter: Payer: Self-pay | Admitting: Internal Medicine

## 2023-06-02 ENCOUNTER — Ambulatory Visit (INDEPENDENT_AMBULATORY_CARE_PROVIDER_SITE_OTHER): Payer: PRIVATE HEALTH INSURANCE | Admitting: Internal Medicine

## 2023-06-02 VITALS — BP 118/76 | HR 69 | Ht 68.0 in | Wt 189.0 lb

## 2023-06-02 DIAGNOSIS — E118 Type 2 diabetes mellitus with unspecified complications: Secondary | ICD-10-CM | POA: Diagnosis not present

## 2023-06-02 DIAGNOSIS — Z0000 Encounter for general adult medical examination without abnormal findings: Secondary | ICD-10-CM | POA: Diagnosis not present

## 2023-06-02 DIAGNOSIS — Z1231 Encounter for screening mammogram for malignant neoplasm of breast: Secondary | ICD-10-CM

## 2023-06-02 DIAGNOSIS — I10 Essential (primary) hypertension: Secondary | ICD-10-CM

## 2023-06-02 DIAGNOSIS — Z7985 Long-term (current) use of injectable non-insulin antidiabetic drugs: Secondary | ICD-10-CM | POA: Diagnosis not present

## 2023-06-02 DIAGNOSIS — E1169 Type 2 diabetes mellitus with other specified complication: Secondary | ICD-10-CM

## 2023-06-02 DIAGNOSIS — Z23 Encounter for immunization: Secondary | ICD-10-CM

## 2023-06-02 DIAGNOSIS — E785 Hyperlipidemia, unspecified: Secondary | ICD-10-CM

## 2023-06-02 NOTE — Assessment & Plan Note (Signed)
 Blood sugars have been stable.  No recent hypoglycemic events requiring assistance. Currently medications are amaryl, MTF, Marcelline Deist and Rybelsus. Lab Results  Component Value Date   HGBA1C 7.2 (A) 01/26/2023   Last visit no changes were made.

## 2023-06-02 NOTE — Assessment & Plan Note (Signed)
 Blood pressure is well controlled.  Current medications lisinopril.  No BP concerns today. Will continue same regimen along with efforts to limit dietary sodium.

## 2023-06-02 NOTE — Assessment & Plan Note (Signed)
 LDL is  Lab Results  Component Value Date   LDLCALC 95 05/31/2022   Current regimen is atorvastatin.  No medication side effects noted. Goal LDL is <70.

## 2023-06-02 NOTE — Progress Notes (Signed)
 Date:  06/02/2023   Name:  Alicia Richard   DOB:  12-20-1975   MRN:  [id number]   Chief Complaint: Annual Exam Alicia Richard is a 48 y.o. female who presents today for her Complete Annual Exam. She feels well. She reports exercising walks 1 - 2 days a week, 20 - 30 minutes. She reports she is sleeping fairly well. Breast complaints none.  Health Maintenance  Topic Date Due   HIV Screening  Never done   Yearly kidney function blood test for diabetes  05/31/2023   COVID-19 Vaccine (4 - 2024-25 season) 06/18/2023*   DTaP/Tdap/Td vaccine (1 - Tdap) 06/01/2024*   Pneumococcal Vaccination (2 of 2 - PCV) 06/01/2024*   Eye exam for diabetics  06/11/2023   Hemoglobin A1C  07/27/2023   Flu Shot  09/23/2023   Mammogram  01/11/2024   Yearly kidney health urinalysis for diabetes  01/26/2024   Complete foot exam   06/01/2024   Colon Cancer Screening  06/23/2026   Pap with HPV screening  03/20/2027   Hepatitis C Screening  Completed   HPV Vaccine  Aged Out   Meningitis B Vaccine  Aged Out  *Topic was postponed. The date shown is not the original due date.    Hypertension This is a chronic problem. The problem is controlled. Pertinent negatives include no chest pain, headaches, palpitations or shortness of breath. Past treatments include ACE inhibitors.  Diabetes She presents for her follow-up diabetic visit. She has type 2 diabetes mellitus. Her disease course has been stable. Pertinent negatives for hypoglycemia include no dizziness, headaches, nervousness/anxiousness or tremors. Pertinent negatives for diabetes include no chest pain, no fatigue, no polydipsia and no polyuria. Current diabetic treatments: glipizide, MTF, Farxiga and GLP-1.  Hyperlipidemia This is a chronic problem. The problem is controlled. Pertinent negatives include no chest pain or shortness of breath. Current antihyperlipidemic treatment includes statins. The current treatment provides significant improvement of lipids.     Review of Systems  Constitutional:  Negative for chills, fatigue and fever.  HENT:  Negative for congestion, hearing loss, tinnitus, trouble swallowing and voice change.   Eyes:  Negative for visual disturbance.  Respiratory:  Negative for cough, chest tightness, shortness of breath and wheezing.   Cardiovascular:  Negative for chest pain, palpitations and leg swelling.  Gastrointestinal:  Negative for abdominal pain, constipation, diarrhea and vomiting.  Endocrine: Negative for polydipsia and polyuria.  Genitourinary:  Negative for dysuria, frequency, genital sores, vaginal bleeding and vaginal discharge.  Musculoskeletal:  Negative for arthralgias, gait problem and joint swelling.  Skin:  Negative for color change and rash.  Neurological:  Negative for dizziness, tremors, light-headedness and headaches.  Hematological:  Negative for adenopathy. Does not bruise/bleed easily.  Psychiatric/Behavioral:  Negative for dysphoric mood and sleep disturbance. The patient is not nervous/anxious.      Lab Results  Component Value Date   NA 142 05/31/2022   K 4.4 05/31/2022   CO2 18 (L) 05/31/2022   GLUCOSE 192 (H) 05/31/2022   BUN 10 05/31/2022   CREATININE 0.60 05/31/2022   CALCIUM 9.9 05/31/2022   EGFR 111 05/31/2022   GFRNONAA 109 07/03/2019   Lab Results  Component Value Date   CHOL 171 05/31/2022   HDL 43 05/31/2022   LDLCALC 95 05/31/2022   TRIG 190 (H) 05/31/2022   CHOLHDL 4.0 05/31/2022   Lab Results  Component Value Date   TSH 3.060 05/31/2022   Lab Results  Component Value Date   HGBA1C 7.2 (  A) 01/26/2023   Lab Results  Component Value Date   WBC 6.4 05/31/2022   HGB 12.9 05/31/2022   HCT 38.7 05/31/2022   MCV 81 05/31/2022   PLT 234 05/31/2022   Lab Results  Component Value Date   ALT 30 05/31/2022   AST 14 05/31/2022   ALKPHOS 114 05/31/2022   BILITOT 0.4 05/31/2022   No results found for: "25OHVITD2", "25OHVITD3", "VD25OH"   Patient Active Problem  List   Diagnosis Date Noted   Colon cancer screening    Adenomatous polyp of ascending colon    Iron deficiency anemia 10/10/2020   Heart murmur 06/04/2020   Hyperlipidemia associated with type 2 diabetes mellitus (HCC) 10/11/2014   Essential (primary) hypertension 08/14/2014   Type II diabetes mellitus with complication (HCC) 08/14/2014    No Known Allergies  Past Surgical History:  Procedure Laterality Date   BREAST BIOPSY Right 10/10/2015   Stereotactic biopsy - benign   COLONOSCOPY WITH PROPOFOL N/A 06/22/2021   Procedure: COLONOSCOPY WITH PROPOFOL;  Surgeon: Toney Reil, MD;  Location: [facility] END[facility]COPY;  Service: Gastroenterology;  Laterality: N/A;   WISDOM TOOTH EXTRACTION      Social History   Tobacco Use   Smoking status: Never   Smokeless tobacco: Never  Vaping Use   Vaping status: Never Used  Substance Use Topics   Alcohol use: No    Alcohol/week: 0.0 standard drinks of alcohol   Drug use: No     Medication list has been reviewed and updated.  Current Meds  Medication Sig   atorvastatin (LIPITOR) 10 MG tablet TAKE 1 TABLET(10 MG) BY MOUTH DAILY AT 6 PM   dapagliflozin propanediol (FARXIGA) 10 MG TABS tablet TAKE 1 TABLET(10 MG) BY MOUTH DAILY BEFORE BREAKFAST   FREESTYLE LITE test strip U UTD ONCE D   glimepiride (AMARYL) 2 MG tablet Take 1 tablet (2 mg total) by mouth daily before breakfast.   lisinopril (ZESTRIL) 20 MG tablet TAKE 1 TABLET(20 MG) BY MOUTH DAILY   metFORMIN (GLUCOPHAGE-XR) 500 MG 24 hr tablet TAKE 2 TABLETS(1000 MG) BY MOUTH DAILY   nystatin-triamcinolone ointment (MYCOLOG) Apply topically 2 (two) times daily.   Semaglutide (RYBELSUS) 14 MG TABS TAKE 1 TABLET(14 MG) BY MOUTH DAILY       06/02/2023    9:24 AM 01/26/2023    9:49 AM 09/02/2022   10:36 AM 05/31/2022    8:10 AM  GAD 7 : Generalized Anxiety Score  Nervous, Anxious, on Edge 1 0 0 1  Control/stop worrying 1 0 0 1  Worry too much - different things 1 0 0 0  Trouble  relaxing 0 0 0 0  Restless 0 0 0 0  Easily annoyed or irritable 0 0 0 0  Afraid - awful might happen 1 0 0 0  Total GAD 7 Score 4 0 0 2  Anxiety Difficulty Somewhat difficult Not difficult at all Not difficult at all Not difficult at all       06/02/2023    9:24 AM 01/26/2023    9:49 AM 09/02/2022   10:36 AM  Depression screen PHQ 2/9  Decreased Interest 0 0 0  Down, Depressed, Hopeless 0 0 0  PHQ - 2 Score 0 0 0  Altered sleeping  0 0  Tired, decreased energy  0 0  Change in appetite  0 0  Feeling bad or failure about yourself   0 0  Trouble concentrating  0 0  Moving slowly or fidgety/restless  0 0  Suicidal thoughts  0 0  PHQ-9 Score  0 0  Difficult doing work/chores  Not difficult at all Not difficult at all    BP Readings from Last 3 Encounters:  06/02/23 118/76  01/26/23 118/82  09/02/22 102/64    Physical Exam Vitals and nursing note reviewed.  Constitutional:      General: She is not in acute distress.    Appearance: She is well-developed.  HENT:     Head: Normocephalic and atraumatic.     Right Ear: Tympanic membrane and ear canal normal.     Left Ear: Tympanic membrane and ear canal normal.     Nose:     Right Sinus: No maxillary sinus tenderness.     Left Sinus: No maxillary sinus tenderness.  Eyes:     General: No scleral icterus.       Right eye: No discharge.        Left eye: No discharge.     Conjunctiva/sclera: Conjunctivae normal.  Neck:     Thyroid: No thyromegaly.     Vascular: No carotid bruit.  Cardiovascular:     Rate and Rhythm: Normal rate and regular rhythm.     Pulses: Normal pulses.     Heart sounds: Normal heart sounds.  Pulmonary:     Effort: Pulmonary effort is normal. No respiratory distress.     Breath sounds: No wheezing.  Abdominal:     General: Bowel sounds are normal.     Palpations: Abdomen is soft.     Tenderness: There is no abdominal tenderness.  Musculoskeletal:     Cervical back: Normal range of motion. No  erythema.     Right lower leg: No edema.     Left lower leg: No edema.  Lymphadenopathy:     Cervical: No cervical adenopathy.  Skin:    General: Skin is warm and dry.     Findings: No rash.  Neurological:     Mental Status: She is alert and oriented to person, place, and time.     Cranial Nerves: No cranial nerve deficit.     Sensory: No sensory deficit.     Deep Tendon Reflexes: Reflexes are normal and symmetric.  Psychiatric:        Attention and Perception: Attention normal.        Mood and Affect: Mood normal.    Diabetic Foot Exam - Simple   Simple Foot Form Diabetic Foot exam was performed with the following findings: Yes 06/02/2023  9:50 AM  Visual Inspection No deformities, no ulcerations, no other skin breakdown bilaterally: Yes Sensation Testing Intact to touch and monofilament testing bilaterally: Yes Pulse Check Posterior Tibialis and Dorsalis pulse intact bilaterally: Yes Comments      Wt Readings from Last 3 Encounters:  06/02/23 189 lb (85.7 kg)  01/26/23 189 lb (85.7 kg)  09/02/22 203 lb 6.4 oz (92.3 kg)    BP 118/76   Pulse 69   Ht 5\' 8"  (1.727 m)   Wt 189 lb (85.7 kg)   SpO2 98%   BMI 28.74 kg/m   Assessment and Plan:  Problem List Items Addressed This Visit       Unprioritized   Essential (primary) hypertension (Chronic)   Blood pressure is well controlled.  Current medications lisinopril.  No BP concerns today. Will continue same regimen along with efforts to limit dietary sodium.       Relevant Orders   CBC with Differential/Platelet   Comprehensive metabolic panel with GFR  TSH   Type II diabetes mellitus with complication (HCC) (Chronic)   Blood sugars have been stable.  No recent hypoglycemic events requiring assistance. Currently medications are amaryl, MTF, Marcelline Deist and Rybelsus. Lab Results  Component Value Date   HGBA1C 7.2 (A) 01/26/2023   Last visit no changes were made.       Relevant Orders   Hemoglobin A1c    Microalbumin / creatinine urine ratio   Hyperlipidemia associated with type 2 diabetes mellitus (HCC) (Chronic)   LDL is  Lab Results  Component Value Date   LDLCALC 95 05/31/2022   Current regimen is atorvastatin.  No medication side effects noted. Goal LDL is <70.       Relevant Orders   Lipid panel   Other Visit Diagnoses       Annual physical exam    -  Primary   mammogram, Pap and CRC scr up to date continue diet changes and exercise   Relevant Orders   CBC with Differential/Platelet   Comprehensive metabolic panel with GFR   Hemoglobin A1c   Lipid panel   Microalbumin / creatinine urine ratio     Encounter for screening mammogram for breast cancer         Immunization due       recommend Prevnar 20 - she will consider for next visit     Long-term current use of injectable noninsulin antidiabetic medication           Return in about 4 months (around 10/02/2023) for DM.    Reubin Milan, MD [facility] [place]

## 2023-06-03 ENCOUNTER — Encounter: Payer: Self-pay | Admitting: Internal Medicine

## 2023-06-03 LAB — COMPREHENSIVE METABOLIC PANEL WITH GFR
ALT: 23 IU/L (ref 0–32)
AST: 20 IU/L (ref 0–40)
Albumin: 4.7 g/dL (ref 3.9–4.9)
Alkaline Phosphatase: 122 IU/L — ABNORMAL HIGH (ref 44–121)
BUN/Creatinine Ratio: 14 (ref 9–23)
BUN: 9 mg/dL (ref 6–24)
Bilirubin Total: 0.6 mg/dL (ref 0.0–1.2)
CO2: 24 mmol/L (ref 20–29)
Calcium: 10 mg/dL (ref 8.7–10.2)
Chloride: 103 mmol/L (ref 96–106)
Creatinine, Ser: 0.63 mg/dL (ref 0.57–1.00)
Globulin, Total: 2.1 g/dL (ref 1.5–4.5)
Glucose: 95 mg/dL (ref 70–99)
Potassium: 4.4 mmol/L (ref 3.5–5.2)
Sodium: 140 mmol/L (ref 134–144)
Total Protein: 6.8 g/dL (ref 6.0–8.5)
eGFR: 109 mL/min/{1.73_m2} (ref >59)

## 2023-06-03 LAB — CBC WITH DIFFERENTIAL/PLATELET
Basophils Absolute: 0.1 10*3/uL (ref 0.0–0.2)
Basos: 1 %
EOS (ABSOLUTE): 0.1 10*3/uL (ref 0.0–0.4)
Eos: 1 %
Hematocrit: 42 % (ref 34.0–46.6)
Hemoglobin: 14.1 g/dL (ref 11.1–15.9)
Immature Grans (Abs): 0 10*3/uL (ref 0.0–0.1)
Immature Granulocytes: 0 %
Lymphocytes Absolute: 2.4 10*3/uL (ref 0.7–3.1)
Lymphs: 33 %
MCH: 27.2 pg (ref 26.6–33.0)
MCHC: 33.6 g/dL (ref 31.5–35.7)
MCV: 81 fL (ref 79–97)
Monocytes Absolute: 0.5 10*3/uL (ref 0.1–0.9)
Monocytes: 6 %
Neutrophils Absolute: 4.2 10*3/uL (ref 1.4–7.0)
Neutrophils: 59 %
Platelets: 292 10*3/uL (ref 150–450)
RBC: 5.18 x10E6/uL (ref 3.77–5.28)
RDW: 13.7 % (ref 11.7–15.4)
WBC: 7.1 10*3/uL (ref 3.4–10.8)

## 2023-06-03 LAB — LIPID PANEL
Chol/HDL Ratio: 3.8 ratio (ref 0.0–4.4)
Cholesterol, Total: 149 mg/dL (ref 100–199)
HDL: 39 mg/dL — ABNORMAL LOW (ref >39)
LDL Chol Calc (NIH): 79 mg/dL (ref 0–99)
Triglycerides: 179 mg/dL — ABNORMAL HIGH (ref 0–149)
VLDL Cholesterol Cal: 31 mg/dL (ref 5–40)

## 2023-06-03 LAB — MICROALBUMIN / CREATININE URINE RATIO
Creatinine, Urine: 16 mg/dL
Microalb/Creat Ratio: <19 mg/g{creat} (ref 0–29)
Microalbumin, Urine: <3 ug/mL

## 2023-06-03 LAB — TSH: TSH: 1.81 u[IU]/mL (ref 0.450–4.500)

## 2023-06-03 LAB — HEMOGLOBIN A1C
Est. average glucose Bld gHb Est-mCnc: 146 mg/dL
Hgb A1c MFr Bld: 6.7 % — ABNORMAL HIGH (ref 4.8–5.6)

## 2023-06-17 LAB — HM DIABETES EYE EXAM

## 2023-06-19 ENCOUNTER — Other Ambulatory Visit: Payer: Self-pay | Admitting: Internal Medicine

## 2023-06-19 DIAGNOSIS — E118 Type 2 diabetes mellitus with unspecified complications: Secondary | ICD-10-CM

## 2023-06-19 DIAGNOSIS — I10 Essential (primary) hypertension: Secondary | ICD-10-CM

## 2023-06-19 DIAGNOSIS — E1169 Type 2 diabetes mellitus with other specified complication: Secondary | ICD-10-CM

## 2023-06-21 NOTE — Telephone Encounter (Signed)
 Requested Prescriptions  Pending Prescriptions Disp Refills   metFORMIN  (GLUCOPHAGE -XR) 500 MG 24 hr tablet [Pharmacy Med Name: METFORMIN  ER 500MG  24HR TABS] 180 tablet 0    Sig: TAKE 2 TABLETS(1000 MG) BY MOUTH DAILY     Endocrinology:  Diabetes - Biguanides Failed - 06/21/2023  1:41 PM      Failed - B12 Level in normal range and within 720 days    No results found for: "VITAMINB12"       Passed - Cr in normal range and within 360 days    Creatinine, Ser  Date Value Ref Range Status  06/02/2023 0.63 0.57 - 1.00 mg/dL Final         Passed - HBA1C is between 0 and 7.9 and within 180 days    Hgb A1c MFr Bld  Date Value Ref Range Status  06/02/2023 6.7 (H) 4.8 - 5.6 % Final    Comment:             Prediabetes: 5.7 - 6.4          Diabetes: >6.4          Glycemic control for adults with diabetes: <7.0          Passed - eGFR in normal range and within 360 days    GFR calc Af Amer  Date Value Ref Range Status  07/03/2019 125 >59 mL/min/1.73 Final    Comment:    **[facility] currently reports eGFR in compliance with the current**   recommendations of the [employer]. [facility] will   update reporting as new guidelines are published from the NKF-ASN   Task force.    GFR calc non Af Amer  Date Value Ref Range Status  07/03/2019 109 >59 mL/min/1.73 Final   eGFR  Date Value Ref Range Status  06/02/2023 109 >59 mL/min/1.73 Final         Passed - Valid encounter within last 6 months    Recent Outpatient Visits           2 weeks ago Annual physical exam   [facility], [place], [place]              Passed - CBC within normal limits and completed in the last 12 months    WBC  Date Value Ref Range Status  06/02/2023 7.1 3.4 - 10.8 x10E3/uL Final   RBC  Date Value Ref Range Status  06/02/2023 5.18 3.77 - 5.28 x10E6/uL Final   Hemoglobin  Date Value Ref Range Status  06/02/2023 14.1 11.1 - 15.9 g/dL  Final   Hematocrit  Date Value Ref Range Status  06/02/2023 42.0 34.0 - 46.6 % Final   MCHC  Date Value Ref Range Status  06/02/2023 33.6 31.5 - 35.7 g/dL Final   [facility]  Date Value Ref Range Status  06/02/2023 27.2 26.6 - 33.0 pg Final   MCV  Date Value Ref Range Status  06/02/2023 81 79 - 97 fL Final   No results found for: "PLTCOUNTKUC", "LABPLAT", "POCPLA" RDW  Date Value Ref Range Status  06/02/2023 13.7 11.7 - 15.4 % Final          atorvastatin  (LIPITOR) 10 MG tablet [Pharmacy Med Name: ATORVASTATIN  10MG  TABLETS] 90 tablet 0    Sig: TAKE 1 TABLET(10 MG) BY MOUTH DAILY AT 6 PM     Cardiovascular:  Antilipid - Statins Failed - 06/21/2023  1:41 PM      Failed -  Lipid Panel in normal range within the last 12 months    Cholesterol, Total  Date Value Ref Range Status  06/02/2023 149 100 - 199 mg/dL Final   LDL Chol Calc (NIH)  Date Value Ref Range Status  06/02/2023 79 0 - 99 mg/dL Final   HDL  Date Value Ref Range Status  06/02/2023 39 (L) >39 mg/dL Final   Triglycerides  Date Value Ref Range Status  06/02/2023 179 (H) 0 - 149 mg/dL Final         Passed - Patient is not pregnant      Passed - Valid encounter within last 12 months    Recent Outpatient Visits           2 weeks ago Annual physical exam   [facility], [place], [place]               lisinopril  (ZESTRIL ) 20 MG tablet [Pharmacy Med Name: LISINOPRIL  20MG  TABLETS] 90 tablet 0    Sig: TAKE 1 TABLET(20 MG) BY MOUTH DAILY     Cardiovascular:  ACE Inhibitors Passed - 06/21/2023  1:41 PM      Passed - Cr in normal range and within 180 days    Creatinine, Ser  Date Value Ref Range Status  06/02/2023 0.63 0.57 - 1.00 mg/dL Final         Passed - K in normal range and within 180 days    Potassium  Date Value Ref Range Status  06/02/2023 4.4 3.5 - 5.2 mmol/L Final         Passed - Patient is not pregnant      Passed - Last BP in  normal range    BP Readings from Last 1 Encounters:  06/02/23 118/76         Passed - Valid encounter within last 6 months    Recent Outpatient Visits           2 weeks ago Annual physical exam   [facility] at [facility], [place], [place]               glimepiride  (AMARYL ) 2 MG tablet [Pharmacy Med Name: GLIMEPIRIDE  2MG  TABLETS] 90 tablet 0    Sig: TAKE 1 TABLET(2 MG) BY MOUTH DAILY BEFORE BREAKFAST     Endocrinology:  Diabetes - Sulfonylureas Passed - 06/21/2023  1:41 PM      Passed - HBA1C is between 0 and 7.9 and within 180 days    Hgb A1c MFr Bld  Date Value Ref Range Status  06/02/2023 6.7 (H) 4.8 - 5.6 % Final    Comment:             Prediabetes: 5.7 - 6.4          Diabetes: >6.4          Glycemic control for adults with diabetes: <7.0          Passed - Cr in normal range and within 360 days    Creatinine, Ser  Date Value Ref Range Status  06/02/2023 0.63 0.57 - 1.00 mg/dL Final         Passed - Valid encounter within last 6 months    Recent Outpatient Visits           2 weeks ago Annual physical exam   [facility] at [facility], [place], [place]  RYBELSUS  14 MG TABS [Pharmacy Med Name: RYBELSUS  14MG  TABLETS] 90 tablet 0    Sig: TAKE 1 TABLET(14 MG) BY MOUTH DAILY     Off-Protocol Failed - 06/21/2023  1:41 PM      Failed - Medication not assigned to a protocol, review manually.      Passed - Valid encounter within last 12 months    Recent Outpatient Visits           2 weeks ago Annual physical exam   [facility] at [facility], [place], [place]               dapagliflozin  propanediol (FARXIGA ) 10 MG TABS tablet [Pharmacy Med Name: DAPAGLIFLOZIN  10MG  TABLETS] 90 tablet 0    Sig: TAKE 1 TABLET(10 MG) BY MOUTH DAILY BEFORE BREAKFAST     Endocrinology:  Diabetes - SGLT2 Inhibitors Passed - 06/21/2023  1:41 PM       Passed - Cr in normal range and within 360 days    Creatinine, Ser  Date Value Ref Range Status  06/02/2023 0.63 0.57 - 1.00 mg/dL Final         Passed - HBA1C is between 0 and 7.9 and within 180 days    Hgb A1c MFr Bld  Date Value Ref Range Status  06/02/2023 6.7 (H) 4.8 - 5.6 % Final    Comment:             Prediabetes: 5.7 - 6.4          Diabetes: >6.4          Glycemic control for adults with diabetes: <7.0          Passed - eGFR in normal range and within 360 days    GFR calc Af Amer  Date Value Ref Range Status  07/03/2019 125 >59 mL/min/1.73 Final    Comment:    **[facility] currently reports eGFR in compliance with the current**   recommendations of the [employer]. [facility] will   update reporting as new guidelines are published from the NKF-ASN   Task force.    GFR calc non Af Amer  Date Value Ref Range Status  07/03/2019 109 >59 mL/min/1.73 Final   eGFR  Date Value Ref Range Status  06/02/2023 109 >59 mL/min/1.73 Final         Passed - Valid encounter within last 6 months    Recent Outpatient Visits           2 weeks ago Annual physical exam   [facility] at [facility], [place], [place]

## 2023-06-27 ENCOUNTER — Encounter: Payer: Self-pay | Admitting: Internal Medicine

## 2023-07-22 ENCOUNTER — Other Ambulatory Visit: Payer: Self-pay

## 2023-07-22 ENCOUNTER — Emergency Department: Payer: PRIVATE HEALTH INSURANCE

## 2023-07-22 ENCOUNTER — Emergency Department
Admission: EM | Admit: 2023-07-22 | Discharge: 2023-07-22 | Disposition: A | Discharge status: Home / Self Care | Payer: PRIVATE HEALTH INSURANCE | Attending: Emergency Medicine | Admitting: Emergency Medicine

## 2023-07-22 DIAGNOSIS — W228XXA Striking against or struck by other objects, initial encounter: Secondary | ICD-10-CM | POA: Diagnosis not present

## 2023-07-22 DIAGNOSIS — S3991XA Unspecified injury of abdomen, initial encounter: Secondary | ICD-10-CM | POA: Diagnosis present

## 2023-07-22 DIAGNOSIS — Z7984 Long term (current) use of oral hypoglycemic drugs: Secondary | ICD-10-CM | POA: Diagnosis not present

## 2023-07-22 DIAGNOSIS — E119 Type 2 diabetes mellitus without complications: Secondary | ICD-10-CM | POA: Insufficient documentation

## 2023-07-22 DIAGNOSIS — S301XXA Contusion of abdominal wall, initial encounter: Secondary | ICD-10-CM | POA: Diagnosis not present

## 2023-07-22 DIAGNOSIS — S20212A Contusion of left front wall of thorax, initial encounter: Secondary | ICD-10-CM | POA: Diagnosis not present

## 2023-07-22 LAB — COMPREHENSIVE METABOLIC PANEL WITH GFR
ALT: 46 U/L — ABNORMAL HIGH (ref 0–44)
AST: 29 U/L (ref 15–41)
Albumin: 4.4 g/dL (ref 3.5–5.0)
Alkaline Phosphatase: 101 U/L (ref 38–126)
Anion gap: 9 (ref 5–15)
BUN: 12 mg/dL (ref 6–20)
CO2: 25 mmol/L (ref 22–32)
Calcium: 9.4 mg/dL (ref 8.9–10.3)
Chloride: 104 mmol/L (ref 98–111)
Creatinine, Ser: 0.55 mg/dL (ref 0.44–1.00)
GFR, Estimated: >60 mL/min (ref >60)
Glucose, Bld: 134 mg/dL — ABNORMAL HIGH (ref 70–99)
Potassium: 4.1 mmol/L (ref 3.5–5.1)
Sodium: 138 mmol/L (ref 135–145)
Total Bilirubin: 0.7 mg/dL (ref 0.0–1.2)
Total Protein: 7.6 g/dL (ref 6.5–8.1)

## 2023-07-22 LAB — URINALYSIS, COMPLETE (UACMP) WITH MICROSCOPIC
Bacteria, UA: NONE SEEN
Bilirubin Urine: NEGATIVE
Glucose, UA: >=500 mg/dL — AB
Hgb urine dipstick: NEGATIVE
Ketones, ur: NEGATIVE mg/dL
Leukocytes,Ua: NEGATIVE
Nitrite: NEGATIVE
Protein, ur: NEGATIVE mg/dL
Specific Gravity, Urine: 1.01 (ref 1.005–1.030)
pH: 5 (ref 5.0–8.0)

## 2023-07-22 LAB — CBC
HCT: 40.7 % (ref 36.0–46.0)
Hemoglobin: 14.1 g/dL (ref 12.0–15.0)
MCH: 27.4 pg (ref 26.0–34.0)
MCHC: 34.6 g/dL (ref 30.0–36.0)
MCV: 79.2 fL — ABNORMAL LOW (ref 80.0–100.0)
Platelets: 314 10*3/uL (ref 150–400)
RBC: 5.14 MIL/uL — ABNORMAL HIGH (ref 3.87–5.11)
RDW: 13.4 % (ref 11.5–15.5)
WBC: 9.4 10*3/uL (ref 4.0–10.5)
nRBC: 0 % (ref 0.0–0.2)

## 2023-07-22 LAB — POC URINE PREG, ED: Preg Test, Ur: NEGATIVE

## 2023-07-22 LAB — LIPASE, BLOOD: Lipase: 60 U/L — ABNORMAL HIGH (ref 11–51)

## 2023-07-22 MED ORDER — HYDROCODONE-ACETAMINOPHEN 5-325 MG PO TABS
1.0000 | ORAL_TABLET | ORAL | Status: AC
  Administered 2023-07-22: 1 via ORAL
  Filled 2023-07-22: qty 1

## 2023-07-22 MED ORDER — HYDROCODONE-ACETAMINOPHEN 5-325 MG PO TABS: 1.0000 | ORAL_TABLET | Freq: Four times a day (QID) | ORAL | 0 refills | Status: DC | PRN

## 2023-07-22 MED ORDER — IOHEXOL 300 MG/ML  SOLN
100.0000 mL | Freq: Once | INTRAMUSCULAR | Status: AC | PRN
Start: 2023-07-22 — End: 2023-07-22
  Administered 2023-07-22: 100 mL via INTRAVENOUS

## 2023-07-22 NOTE — ED Provider Notes (Signed)
 [facility] AT [facility] Provider Note   CSN: [id number] Arrival date & time: 07/22/23  1811     History  Chief Complaint  Patient presents with   Abdominal Pain    Alicia Richard is a 48 y.o. female with history of diabetes presents to the emergency department for evaluation of blunt force trauma to her abdomen.  Patient states 5 days ago she was on a [employer], the piggy board jackknifed and the base of the boogie board went into her abdomen, left upper quadrant.  She developed severe pain and discomfort.  She has been taking it easy over the last 5 days and feels as if she has had no improvement in pain despite ibuprofen.  She feels if the pain is increasing and expanding more along the periumbilical region.  She has pain with movement such as lying down and lying flat.  She denies any urinary symptoms, fevers, numbness tingling or radicular symptoms.  HPI     Home Medications Prior to Admission medications   Medication Sig Start Date End Date Taking? Authorizing Provider  HYDROcodone-acetaminophen (NORCO/VICODIN) 5-325 MG tablet Take 1 tablet by mouth every 6 (six) hours as needed for moderate pain (pain score 4-6). 07/22/23  Yes Coralyn Derry, PA-C  atorvastatin  (LIPITOR) 10 MG tablet TAKE 1 TABLET(10 MG) BY MOUTH DAILY AT 6 PM 06/21/23   Sheron Dixons, MD  dapagliflozin  propanediol (FARXIGA ) 10 MG TABS tablet TAKE 1 TABLET(10 MG) BY MOUTH DAILY BEFORE BREAKFAST 06/21/23   Sheron Dixons, MD  FREESTYLE LITE test strip U UTD ONCE D 10/21/14   [provider]  glimepiride  (AMARYL ) 2 MG tablet TAKE 1 TABLET(2 MG) BY MOUTH DAILY BEFORE BREAKFAST 06/21/23   Sheron Dixons, MD  lisinopril  (ZESTRIL ) 20 MG tablet TAKE 1 TABLET(20 MG) BY MOUTH DAILY 06/21/23   Sheron Dixons, MD  metFORMIN  (GLUCOPHAGE -XR) 500 MG 24 hr tablet TAKE 2 TABLETS(1000 MG) BY MOUTH DAILY 06/21/23   Berglund, Laura H, MD  nystatin-triamcinolone ointment [facility])  Apply topically 2 (two) times daily. 11/27/19   [provider]  RYBELSUS  14 MG TABS TAKE 1 TABLET(14 MG) BY MOUTH DAILY 06/21/23   Sheron Dixons, MD      Allergies    Patient has no known allergies.    Review of Systems   Review of Systems  Physical Exam Updated Vital Signs BP (!) 144/101   Pulse 81   Temp 98.3 F (36.8 C) (Oral)   Resp 20   SpO2 98%  Physical Exam Constitutional:      Appearance: She is well-developed.  HENT:     Head: Normocephalic and atraumatic.     Right Ear: External ear normal.     Left Ear: External ear normal.     Mouth/Throat:     Pharynx: No oropharyngeal exudate or posterior oropharyngeal erythema.  Eyes:     Conjunctiva/sclera: Conjunctivae normal.  Cardiovascular:     Rate and Rhythm: Normal rate and regular rhythm.     Pulses: Normal pulses.  Pulmonary:     Effort: Pulmonary effort is normal. No respiratory distress.     Breath sounds: Normal breath sounds. No wheezing or rales.  Abdominal:     General: Bowel sounds are normal. There is no distension.     Palpations: There is no mass.     Tenderness: There is abdominal tenderness. There is guarding.     Comments: Left upper quadrant tenderness along the distal rib anterior  axillary line.  No distention or masses.  Mild tenderness in to the periumbilical region.  No right abdominal tenderness.  No pelvic tenderness.  No CVA tenderness bilaterally  Musculoskeletal:        General: Normal range of motion.     Cervical back: Normal range of motion.  Skin:    General: Skin is warm.     Findings: No rash.  Neurological:     General: No focal deficit present.     Mental Status: She is alert and oriented to person, place, and time. Mental status is at baseline.  Psychiatric:        Behavior: Behavior normal.        Thought Content: Thought content normal.     ED Results / Procedures / Treatments   Labs (all labs ordered are listed, but only abnormal results are  displayed) Labs Reviewed  CBC - Abnormal; Notable for the following components:      Result Value   RBC 5.14 (*)    MCV 79.2 (*)    All other components within normal limits  COMPREHENSIVE METABOLIC PANEL WITH GFR - Abnormal; Notable for the following components:   Glucose, Bld 134 (*)    ALT 46 (*)    All other components within normal limits  LIPASE, BLOOD - Abnormal; Notable for the following components:   Lipase 60 (*)    All other components within normal limits  URINALYSIS, COMPLETE (UACMP) WITH MICROSCOPIC - Abnormal; Notable for the following components:   Color, Urine STRAW (*)    APPearance CLEAR (*)    Glucose, UA >=500 (*)    All other components within normal limits  POC URINE PREG, ED    EKG None  Radiology CT ABDOMEN PELVIS W CONTRAST Result Date: 07/22/2023 CLINICAL DATA:  Left abdominal trauma. EXAM: CT ABDOMEN AND PELVIS WITH CONTRAST TECHNIQUE: Multidetector CT imaging of the abdomen and pelvis was performed using the standard protocol following bolus administration of intravenous contrast. RADIATION DOSE REDUCTION: This exam was performed according to the departmental dose-optimization program which includes automated exposure control, adjustment of the mA and/or kV according to patient size and/or use of iterative reconstruction technique. CONTRAST:  100mL OMNIPAQUE IOHEXOL 300 MG/ML  SOLN COMPARISON:  None Available. FINDINGS: Lower chest: No acute abnormality. Hepatobiliary: Small gallstones are present. There is no biliary ductal dilatation. The liver is within normal limits. Pancreas: Unremarkable. No pancreatic ductal dilatation or surrounding inflammatory changes. Spleen: Normal in size without focal abnormality. Adrenals/Urinary Tract: Adrenal glands are unremarkable. Kidneys are normal, without renal calculi, focal lesion, or hydronephrosis. Bladder is unremarkable. Stomach/Bowel: Stomach is within normal limits. Appendix appears normal. No evidence of bowel  wall thickening, distention, or inflammatory changes. Vascular/Lymphatic: No significant vascular findings are present. No enlarged abdominal or pelvic lymph nodes. Reproductive: Uterus and bilateral adnexa are unremarkable. There are prominent pelvic vessels bilaterally. Other: There is no ascites. There is a small fat containing umbilical hernia. No focal body wall hematoma Musculoskeletal: No fracture is seen. IMPRESSION: 1. No acute localizing process in the abdomen or pelvis. 2. Cholelithiasis. 3. Prominent pelvic vessels bilaterally, nonspecific but can be seen in the setting of pelvic congestion syndrome. Electronically Signed   By: Tyron Gallon M.D.   On: 07/22/2023 21:36   DG Ribs Unilateral W/Chest Left Result Date: 07/22/2023 CLINICAL DATA:  Fall, left chest pain EXAM: LEFT RIBS AND CHEST - 3+ VIEW COMPARISON:  None Available. FINDINGS: No fracture or other bone lesions are seen involving  the ribs. There is no evidence of pneumothorax or pleural effusion. Both lungs are clear. Heart size and mediastinal contours are within normal limits. IMPRESSION: Negative. Electronically Signed   By: Worthy Heads M.D.   On: 07/22/2023 20:15    Procedures Procedures    Medications Ordered in ED Medications  HYDROcodone -acetaminophen  (NORCO/VICODIN) 5-325 MG per tablet 1 tablet (has no administration in time range)  iohexol  (OMNIPAQUE ) 300 MG/ML solution 100 mL (100 mLs Intravenous Contrast Given 07/22/23 2037)    ED Course/ Medical Decision Making/ A&P                                 Medical Decision Making Amount and/or Complexity of Data Reviewed Labs: ordered. Radiology: ordered.  Risk Prescription drug management.   48 year old female with left rib and abdominal wall contusion.  CBC and CMP within normal limits.  Urinalysis within normal limits.  Negative pregnancy test.  Chest x-ray negative for pneumothorax, left ribs negative for fracture.  CT abdomen pelvis showed no acute  intraabdominal process.  We discussed musculoskeletal pain, contusion.  Will have her continue with Tylenol  and ibuprofen and we prescribe Norco for breakthrough pain.  She is given strict return precautions understands signs and symptoms to return to the ED for. Final Clinical Impression(s) / ED Diagnoses Final diagnoses:  Contusion of abdominal wall, initial encounter  Rib contusion, left, initial encounter    Rx / DC Orders ED Discharge Orders          Ordered    HYDROcodone -acetaminophen  (NORCO/VICODIN) 5-325 MG tablet  Every 6 hours PRN        07/22/23 2200              Joseantonio Dittmar C, PA-C 07/22/23 2202    Bryson Carbine, MD 07/22/23 2204

## 2023-07-22 NOTE — ED Triage Notes (Signed)
 Pt to ED via POV from [facility]. Pt reports was boogy boarding on Sunday and fell and board hit left side of abdomen. Pt reports increased pain and unable to lay flat due to pain.

## 2023-07-22 NOTE — Discharge Instructions (Addendum)
 Please alternate Tylenol and ibuprofen every 6 hours.  Use hydrocodone as needed for moderate to severe pain.  Return to the ER for any worsening symptoms or any urgent changes in your health

## 2023-08-02 ENCOUNTER — Other Ambulatory Visit: Payer: Self-pay | Admitting: Internal Medicine

## 2023-08-02 DIAGNOSIS — E118 Type 2 diabetes mellitus with unspecified complications: Secondary | ICD-10-CM

## 2023-08-02 NOTE — Telephone Encounter (Signed)
 Requested Prescriptions  Refused Prescriptions Disp Refills   RYBELSUS  7 MG TABS [Pharmacy Med Name: RYBELSUS  7MG  TABLETS] 30 tablet 0    Sig: TAKE 1 TABLET(7 MG) BY MOUTH DAILY     Off-Protocol Failed - 08/02/2023  5:46 PM      Failed - Medication not assigned to a protocol, review manually.      Passed - Valid encounter within last 12 months    Recent Outpatient Visits           2 months ago Annual physical exam   [facility] at [facility], [place], MD

## 2023-10-06 ENCOUNTER — Encounter: Payer: Self-pay | Admitting: Internal Medicine

## 2023-10-06 ENCOUNTER — Ambulatory Visit (INDEPENDENT_AMBULATORY_CARE_PROVIDER_SITE_OTHER): Payer: PRIVATE HEALTH INSURANCE | Admitting: Internal Medicine

## 2023-10-06 VITALS — BP 124/78 | HR 70 | Ht 68.0 in | Wt 193.0 lb

## 2023-10-06 DIAGNOSIS — E118 Type 2 diabetes mellitus with unspecified complications: Secondary | ICD-10-CM

## 2023-10-06 DIAGNOSIS — Z7984 Long term (current) use of oral hypoglycemic drugs: Secondary | ICD-10-CM

## 2023-10-06 DIAGNOSIS — Z1231 Encounter for screening mammogram for malignant neoplasm of breast: Secondary | ICD-10-CM | POA: Diagnosis not present

## 2023-10-06 LAB — POCT GLYCOSYLATED HEMOGLOBIN (HGB A1C): Hemoglobin A1C: 7.1 % — AB (ref 4.0–5.6)

## 2023-10-06 NOTE — Patient Instructions (Signed)
 Call [facility] to schedule your mammogram at [phone].

## 2023-10-06 NOTE — Progress Notes (Signed)
 Date:  10/06/2023   Name:  Alicia Richard   DOB:  May 05, 1975   MRN:  [id number]   Chief Complaint: Diabetes  Diabetes She presents for her follow-up diabetic visit. She has type 2 diabetes mellitus. Her disease course has been stable. Pertinent negatives for hypoglycemia include no dizziness or headaches. Pertinent negatives for diabetes include no chest pain, no fatigue and no weakness. Current diabetic treatments: MTF, Farxiga, Rybelsus and glimepiride.    Review of Systems  Constitutional:  Negative for fatigue and unexpected weight change.  HENT:  Negative for trouble swallowing.   Eyes:  Negative for visual disturbance.  Respiratory:  Negative for cough, chest tightness, shortness of breath and wheezing.   Cardiovascular:  Negative for chest pain, palpitations and leg swelling.  Gastrointestinal:  Negative for abdominal pain, constipation and diarrhea.  Musculoskeletal:  Negative for arthralgias and myalgias.  Neurological:  Negative for dizziness, weakness, light-headedness and headaches.     Lab Results  Component Value Date   NA 138 07/22/2023   K 4.1 07/22/2023   CO2 25 07/22/2023   GLUCOSE 134 (H) 07/22/2023   BUN 12 07/22/2023   CREATININE 0.55 07/22/2023   CALCIUM 9.4 07/22/2023   EGFR 109 06/02/2023   GFRNONAA >60 07/22/2023   Lab Results  Component Value Date   CHOL 149 06/02/2023   HDL 39 (L) 06/02/2023   LDLCALC 79 06/02/2023   TRIG 179 (H) 06/02/2023   CHOLHDL 3.8 06/02/2023   Lab Results  Component Value Date   TSH 1.810 06/02/2023   Lab Results  Component Value Date   HGBA1C 7.1 (A) 10/06/2023   Lab Results  Component Value Date   WBC 9.4 07/22/2023   HGB 14.1 07/22/2023   HCT 40.7 07/22/2023   MCV 79.2 (L) 07/22/2023   PLT 314 07/22/2023   Lab Results  Component Value Date   ALT 46 (H) 07/22/2023   AST 29 07/22/2023   ALKPHOS 101 07/22/2023   BILITOT 0.7 07/22/2023   No results found for: MARIEN BOLLS, VD25OH    Patient Active Problem List   Diagnosis Date Noted   Colon cancer screening    Adenomatous polyp of ascending colon    Iron deficiency anemia 10/10/2020   Heart murmur 06/04/2020   Hyperlipidemia associated with type 2 diabetes mellitus (HCC) 10/11/2014   Essential (primary) hypertension 08/14/2014   Type II diabetes mellitus with complication (HCC) 08/14/2014    No Known Allergies  Past Surgical History:  Procedure Laterality Date   BREAST BIOPSY Right 10/10/2015   Stereotactic biopsy - benign   COLONOSCOPY WITH PROPOFOL N/A 06/22/2021   Procedure: COLONOSCOPY WITH PROPOFOL;  Surgeon: Unk Corinn Skiff, MD;  [facility] ENDOSCOPY;  Service: Gastroenterology;  Laterality: N/A;   WISDOM TOOTH EXTRACTION      Social History   Tobacco Use   Smoking status: Never   Smokeless tobacco: Never  Vaping Use   Vaping status: Never Used  Substance Use Topics   Alcohol use: No    Alcohol/week: 0.0 standard drinks of alcohol   Drug use: No     Medication list has been reviewed and updated.  Current Meds  Medication Sig   atorvastatin (LIPITOR) 10 MG tablet TAKE 1 TABLET(10 MG) BY MOUTH DAILY AT 6 PM   dapagliflozin propanediol (FARXIGA) 10 MG TABS tablet TAKE 1 TABLET(10 MG) BY MOUTH DAILY BEFORE BREAKFAST   FREESTYLE LITE test strip U UTD ONCE D   glimepiride (AMARYL) 2 MG tablet TAKE 1 TABLET(2  MG) BY MOUTH DAILY BEFORE BREAKFAST   HYDROcodone-acetaminophen (NORCO/VICODIN) 5-325 MG tablet Take 1 tablet by mouth every 6 (six) hours as needed for moderate pain (pain score 4-6).   lisinopril (ZESTRIL) 20 MG tablet TAKE 1 TABLET(20 MG) BY MOUTH DAILY   metFORMIN (GLUCOPHAGE-XR) 500 MG 24 hr tablet TAKE 2 TABLETS(1000 MG) BY MOUTH DAILY   nystatin-triamcinolone ointment (MYCOLOG) Apply topically 2 (two) times daily.   RYBELSUS 14 MG TABS TAKE 1 TABLET(14 MG) BY MOUTH DAILY       10/06/2023    9:32 AM 10/06/2023    9:27 AM 06/02/2023    9:24 AM 01/26/2023    9:49 AM  GAD 7 :  Generalized Anxiety Score  Nervous, Anxious, on Edge 0 0 1 0  Control/stop worrying 0 0 1 0  Worry too much - different things 0 0 1 0  Trouble relaxing 0 0 0 0  Restless 0 0 0 0  Easily annoyed or irritable 0 0 0 0  Afraid - awful might happen 0 0 1 0  Total GAD 7 Score 0 0 4 0  Anxiety Difficulty Not difficult at all Not difficult at all Somewhat difficult Not difficult at all       10/06/2023    9:32 AM 10/06/2023    9:27 AM 06/02/2023    9:24 AM  Depression screen PHQ 2/9  Decreased Interest 0 0 0  Down, Depressed, Hopeless 0 0 0  PHQ - 2 Score 0 0 0  Altered sleeping 1 0   Tired, decreased energy 1 0   Change in appetite 0 0   Feeling bad or failure about yourself  0 0   Trouble concentrating 0 0   Moving slowly or fidgety/restless 0 0   Suicidal thoughts 0 0   PHQ-9 Score 2 0   Difficult doing work/chores Not difficult at all Not difficult at all     BP Readings from Last 3 Encounters:  10/06/23 124/78  07/22/23 135/81  06/02/23 118/76    Physical Exam Vitals and nursing note reviewed.  Constitutional:      General: She is not in acute distress.    Appearance: She is well-developed.  Neck:     Vascular: No carotid bruit.  Cardiovascular:     Rate and Rhythm: Normal rate and regular rhythm.  Pulmonary:     Effort: Pulmonary effort is normal. No respiratory distress.     Breath sounds: No wheezing or rhonchi.  Musculoskeletal:     Right lower leg: No edema.     Left lower leg: No edema.  Lymphadenopathy:     Cervical: No cervical adenopathy.  Skin:    General: Skin is warm and dry.     Findings: No rash.  Neurological:     General: No focal deficit present.     Mental Status: She is alert and oriented to person, place, and time.  Psychiatric:        Mood and Affect: Mood normal.        Behavior: Behavior normal.     Wt Readings from Last 3 Encounters:  10/06/23 193 lb (87.5 kg)  06/02/23 189 lb (85.7 kg)  01/26/23 189 lb (85.7 kg)    BP 124/78    Pulse 70   Ht 5' 8 (1.727 m)   Wt 193 lb (87.5 kg)   SpO2 97%   BMI 29.35 kg/m   Assessment and Plan:  Problem List Items Addressed This Visit  Unprioritized   Type II diabetes mellitus with complication (HCC) - Primary (Chronic)   Blood sugars have been stable.  No hypoglycemic events since last visit. Currently medications are MTF, Farxiga , Rybelsus  and glimepiride . Last visit medical regimen changes were none. Lab Results  Component Value Date   HGBA1C 6.7 (H) 06/02/2023  A1C today = 7.1 Will continue same medications for now along with diet and exercise.  Consider possible trial of Mounjaro if worsening next visit.       Relevant Orders   POCT glycosylated hemoglobin (Hb A1C) (Completed)   Other Visit Diagnoses       Long term current use of oral hypoglycemic drug         Encounter for screening mammogram for breast cancer       Relevant Orders   MM 3D SCREENING MAMMOGRAM BILATERAL BREAST       Return in about 4 months (around 02/05/2024) for DM, HTN.    Leita HILARIO Adie, MD [facility] [place]

## 2023-10-06 NOTE — Assessment & Plan Note (Addendum)
 Blood sugars have been stable.  No hypoglycemic events since last visit. Currently medications are MTF, Farxiga , Rybelsus  and glimepiride . Last visit medical regimen changes were none. Lab Results  Component Value Date   HGBA1C 6.7 (H) 06/02/2023  A1C today = 7.1 Will continue same medications for now along with diet and exercise.  Consider possible trial of Mounjaro if worsening next visit.

## 2023-10-14 ENCOUNTER — Other Ambulatory Visit: Payer: Self-pay | Admitting: Internal Medicine

## 2023-10-14 DIAGNOSIS — E118 Type 2 diabetes mellitus with unspecified complications: Secondary | ICD-10-CM

## 2023-10-14 NOTE — Telephone Encounter (Signed)
 Requested medications are due for refill today.  yes  Requested medications are on the active medications list.  yes  Last refill. 06/21/2023 #90 0 rf  Future visit scheduled.   yes  Notes to clinic.  Medication not assigned to a protocol. Please review for refill.    Requested Prescriptions  Pending Prescriptions Disp Refills   RYBELSUS  14 MG TABS [Pharmacy Med Name: RYBELSUS  14MG  TABLETS] 90 tablet 0    Sig: TAKE 1 TABLET(14 MG) BY MOUTH DAILY     Off-Protocol Failed - 10/14/2023  5:18 PM      Failed - Medication not assigned to a protocol, review manually.      Passed - Valid encounter within last 12 months    Recent Outpatient Visits           1 week ago Type II diabetes mellitus with complication [name]   [name], Alicia DEL, MD   4 months ago Annual physical exam   [name], Alicia DEL, MD       Future Appointments             In 3 months Alicia Richard, Alicia DEL, MD [facility] at [facility], [facility]

## 2023-11-11 ENCOUNTER — Other Ambulatory Visit: Payer: Self-pay | Admitting: Internal Medicine

## 2023-11-11 DIAGNOSIS — E118 Type 2 diabetes mellitus with unspecified complications: Secondary | ICD-10-CM

## 2023-11-11 DIAGNOSIS — E785 Hyperlipidemia, unspecified: Secondary | ICD-10-CM

## 2023-11-11 DIAGNOSIS — I10 Essential (primary) hypertension: Secondary | ICD-10-CM

## 2023-11-14 NOTE — Telephone Encounter (Signed)
 Requested Prescriptions  Pending Prescriptions Disp Refills   glimepiride  (AMARYL ) 2 MG tablet [Pharmacy Med Name: GLIMEPIRIDE  2MG  TABLETS] 90 tablet 0    Sig: TAKE 1 TABLET(2 MG) BY MOUTH DAILY BEFORE BREAKFAST     Endocrinology:  Diabetes - Sulfonylureas Passed - 11/14/2023  9:40 AM      Passed - HBA1C is between 0 and 7.9 and within 180 days    Hemoglobin A1C  Date Value Ref Range Status  10/06/2023 7.1 (A) 4.0 - 5.6 % Final   Hgb A1c MFr Bld  Date Value Ref Range Status  06/02/2023 6.7 (H) 4.8 - 5.6 % Final    Comment:             Prediabetes: 5.7 - 6.4          Diabetes: >6.4          Glycemic control for adults with diabetes: <7.0          Passed - Cr in normal range and within 360 days    Creatinine, Ser  Date Value Ref Range Status  07/22/2023 0.55 0.44 - 1.00 mg/dL Final         Passed - Valid encounter within last 6 months    Recent Outpatient Visits           1 month ago Type II diabetes mellitus with complication [facility])   [place] [facility] Medicine at [facility], [name], MD   5 months ago Annual physical exam   [facility] Primary Care & Sports Medicine at [facility], [name], MD       Future Appointments             In 2 months Alicia [name], MD [facility] Primary Care & Sports Medicine at [facility], [phone] [place]             atorvastatin  (LIPITOR) 10 MG tablet [Pharmacy Med Name: ATORVASTATIN  10MG  TABLETS] 90 tablet 0    Sig: TAKE 1 TABLET(10 MG) BY MOUTH DAILY AT 6 PM     Cardiovascular:  Antilipid - Statins Failed - 11/14/2023  9:40 AM      Failed - Lipid Panel in normal range within the last 12 months    Cholesterol, Total  Date Value Ref Range Status  06/02/2023 149 100 - 199 mg/dL Final   LDL Chol Calc (NIH)  Date Value Ref Range Status  06/02/2023 79 0 - 99 mg/dL Final   HDL  Date Value Ref Range Status  06/02/2023 39 (L) >39 mg/dL Final   Triglycerides  Date Value Ref Range  Status  06/02/2023 179 (H) 0 - 149 mg/dL Final         Passed - Patient is not pregnant      Passed - Valid encounter within last 12 months    Recent Outpatient Visits           1 month ago Type II diabetes mellitus with complication [facility])   [place] [facility] Sports Medicine at [facility], [name], MD   5 months ago Annual physical exam   [facility] at [facility], [name], MD       Future Appointments             In 2 months Alicia, [name], MD [facility] Primary Care & Sports Medicine at [facility], [phone] [place]  FARXIGA  10 MG TABS tablet [Pharmacy Med Name: FARXIGA  10MG  TABLETS] 90 tablet 0    Sig: TAKE 1 TABLET(10 MG) BY MOUTH DAILY BEFORE BREAKFAST     Endocrinology:  Diabetes - SGLT2 Inhibitors Passed - 11/14/2023  9:40 AM      Passed - Cr in normal range and within 360 days    Creatinine, Ser  Date Value Ref Range Status  07/22/2023 0.55 0.44 - 1.00 mg/dL Final         Passed - HBA1C is between 0 and 7.9 and within 180 days    Hemoglobin A1C  Date Value Ref Range Status  10/06/2023 7.1 (A) 4.0 - 5.6 % Final   Hgb A1c MFr Bld  Date Value Ref Range Status  06/02/2023 6.7 (H) 4.8 - 5.6 % Final    Comment:             Prediabetes: 5.7 - 6.4          Diabetes: >6.4          Glycemic control for adults with diabetes: <7.0          Passed - eGFR in normal range and within 360 days    GFR calc Af Amer  Date Value Ref Range Status  07/03/2019 125 >59 mL/min/1.73 Final    Comment:    **[facility] currently reports eGFR in compliance with the current**   recommendations of the [employer]. [facility] will   update reporting as new guidelines are published from the NKF-ASN   Task force.    GFR, Estimated  Date Value Ref Range Status  07/22/2023 >60 >60 mL/min Final    Comment:    (NOTE) Calculated using the CKD-EPI Creatinine Equation (2021)    eGFR   Date Value Ref Range Status  06/02/2023 109 >59 mL/min/1.73 Final         Passed - Valid encounter within last 6 months    Recent Outpatient Visits           1 month ago Type II diabetes mellitus with complication [facility])   [place] Primary Care & Sports Medicine at [facility], [name], MD   5 months ago Annual physical exam   [place] [facility] Sports Medicine at [facility], [name], MD       Future Appointments             In 2 months Alicia [name], MD [facility] Primary Care & Sports Medicine at [facility], [phone] [place]             metFORMIN  (GLUCOPHAGE -XR) 500 MG 24 hr tablet [Pharmacy Med Name: METFORMIN  ER 500MG  24HR TABS] 180 tablet 0    Sig: TAKE 2 TABLETS(1000 MG) BY MOUTH DAILY     Endocrinology:  Diabetes - Biguanides Failed - 11/14/2023  9:40 AM      Failed - B12 Level in normal range and within 720 days    No results found for: VITAMINB12       Passed - Cr in normal range and within 360 days    Creatinine, Ser  Date Value Ref Range Status  07/22/2023 0.55 0.44 - 1.00 mg/dL Final         Passed - HBA1C is between 0 and 7.9 and within 180 days    Hemoglobin A1C  Date Value Ref Range Status  10/06/2023 7.1 (A) 4.0 - 5.6 % Final   Hgb A1c MFr Bld  Date Value Ref  Range Status  06/02/2023 6.7 (H) 4.8 - 5.6 % Final    Comment:             Prediabetes: 5.7 - 6.4          Diabetes: >6.4          Glycemic control for adults with diabetes: <7.0          Passed - eGFR in normal range and within 360 days    GFR calc Af Amer  Date Value Ref Range Status  07/03/2019 125 >59 mL/min/1.73 Final    Comment:    **[facility] currently reports eGFR in compliance with the current**   recommendations of the [employer]. [facility] will   update reporting as new guidelines are published from the NKF-ASN   Task force.    GFR, Estimated  Date Value Ref Range Status  07/22/2023 >60 >60 mL/min Final     Comment:    (NOTE) Calculated using the CKD-EPI Creatinine Equation (2021)    eGFR  Date Value Ref Range Status  06/02/2023 109 >59 mL/min/1.73 Final         Passed - Valid encounter within last 6 months    Recent Outpatient Visits           1 month ago Type II diabetes mellitus with complication [facility])   [place] Primary Care & Sports Medicine at [facility], [name], MD   5 months ago Annual physical exam   [place] Primary Care & Sports Medicine at [facility], [name], MD       Future Appointments             In 2 months Alicia [name], MD [facility] Primary Care & Sports Medicine at [facility], [phone] [place]            Passed - CBC within normal limits and completed in the last 12 months    WBC  Date Value Ref Range Status  07/22/2023 9.4 4.0 - 10.5 K/uL Final   RBC  Date Value Ref Range Status  07/22/2023 5.14 (H) 3.87 - 5.11 MIL/uL Final   Hemoglobin  Date Value Ref Range Status  07/22/2023 14.1 12.0 - 15.0 g/dL Final  95/89/7974 85.8 11.1 - 15.9 g/dL Final   HCT  Date Value Ref Range Status  07/22/2023 40.7 36.0 - 46.0 % Final   Hematocrit  Date Value Ref Range Status  06/02/2023 42.0 34.0 - 46.6 % Final   MCHC  Date Value Ref Range Status  07/22/2023 34.6 30.0 - 36.0 g/dL Final   [facility]  Date Value Ref Range Status  07/22/2023 27.4 26.0 - 34.0 pg Final   MCV  Date Value Ref Range Status  07/22/2023 79.2 (L) 80.0 - 100.0 fL Final  06/02/2023 81 79 - 97 fL Final   No results found for: PLTCOUNTKUC, LABPLAT, POCPLA RDW  Date Value Ref Range Status  07/22/2023 13.4 11.5 - 15.5 % Final  06/02/2023 13.7 11.7 - 15.4 % Final          lisinopril  (ZESTRIL ) 20 MG tablet [Pharmacy Med Name: LISINOPRIL  20MG  TABLETS] 90 tablet 0    Sig: TAKE 1 TABLET(20 MG) BY MOUTH DAILY     Cardiovascular:  ACE Inhibitors Passed - 11/14/2023  9:40 AM      Passed - Cr in normal range and within 180 days     Creatinine, Ser  Date Value Ref Range Status  07/22/2023 0.55 0.44 - 1.00  mg/dL Final         Passed - K in normal range and within 180 days    Potassium  Date Value Ref Range Status  07/22/2023 4.1 3.5 - 5.1 mmol/L Final         Passed - Patient is not pregnant      Passed - Last BP in normal range    BP Readings from Last 1 Encounters:  10/06/23 124/78         Passed - Valid encounter within last 6 months    Recent Outpatient Visits           1 month ago Type II diabetes mellitus with complication [facility])   [place] Primary Care & Sports Medicine at [facility], [name], MD   5 months ago Annual physical exam   [facility] Primary Care & Sports Medicine at [facility], [name], MD       Future Appointments             In 2 months Alicia, [name], MD [facility] Primary Care & Sports Medicine at [facility], [phone] [place]

## 2024-01-12 ENCOUNTER — Ambulatory Visit
Admission: RE | Admit: 2024-01-12 | Discharge: 2024-01-12 | Disposition: A | Discharge status: Home / Self Care | Payer: PRIVATE HEALTH INSURANCE | Source: Ambulatory Visit | Attending: Internal Medicine | Admitting: Internal Medicine

## 2024-01-12 DIAGNOSIS — Z1231 Encounter for screening mammogram for malignant neoplasm of breast: Secondary | ICD-10-CM | POA: Diagnosis present

## 2024-01-27 ENCOUNTER — Ambulatory Visit: Payer: PRIVATE HEALTH INSURANCE | Admitting: Internal Medicine

## 2024-01-30 ENCOUNTER — Other Ambulatory Visit: Payer: Self-pay | Admitting: Internal Medicine

## 2024-01-30 DIAGNOSIS — E118 Type 2 diabetes mellitus with unspecified complications: Secondary | ICD-10-CM

## 2024-01-31 NOTE — Telephone Encounter (Signed)
 Requested medications are due for refill today.  yes  Requested medications are on the active medications list.  yes  Last refill. 10/17/2023 #90 0 rf  Future visit scheduled.   yes  Notes to clinic.  Medication not assigned to a protocol. Please review for refill.     Requested Prescriptions  Pending Prescriptions Disp Refills   RYBELSUS  14 MG TABS [Pharmacy Med Name: RYBELSUS  14MG  TABLETS] 90 tablet 0    Sig: TAKE 1 TABLET(14 MG) BY MOUTH DAILY     Off-Protocol Failed - 01/31/2024  4:49 PM      Failed - Medication not assigned to a protocol, review manually.      Passed - Valid encounter within last 12 months    Recent Outpatient Visits           3 months ago Type II diabetes mellitus with complication [facility])   [place] Primary Care & Sports Medicine at [facility], [name], MD   8 months ago Annual physical exam   [facility] Primary Care & Sports Medicine at [facility], [name], MD

## 2024-02-14 ENCOUNTER — Encounter: Payer: Self-pay | Admitting: Internal Medicine

## 2024-02-14 ENCOUNTER — Ambulatory Visit: Payer: PRIVATE HEALTH INSURANCE | Admitting: Internal Medicine

## 2024-02-14 VITALS — BP 126/76 | HR 86 | Ht 68.0 in | Wt 192.0 lb

## 2024-02-14 DIAGNOSIS — E118 Type 2 diabetes mellitus with unspecified complications: Secondary | ICD-10-CM

## 2024-02-14 DIAGNOSIS — Z7984 Long term (current) use of oral hypoglycemic drugs: Secondary | ICD-10-CM

## 2024-02-14 DIAGNOSIS — I10 Essential (primary) hypertension: Secondary | ICD-10-CM

## 2024-02-14 DIAGNOSIS — Z23 Encounter for immunization: Secondary | ICD-10-CM | POA: Diagnosis not present

## 2024-02-14 LAB — POCT GLYCOSYLATED HEMOGLOBIN (HGB A1C): Hemoglobin A1C: 7.1 % — AB (ref 4.0–5.6)

## 2024-02-14 MED ORDER — RYBELSUS 14 MG PO TABS: 14.0000 mg | ORAL_TABLET | Freq: Every day | ORAL | 5 refills | Status: AC

## 2024-02-14 NOTE — Assessment & Plan Note (Signed)
 Well controlled blood pressure today. Current regimen is lisinopril . No medication side effects noted.  Renal function normal.

## 2024-02-14 NOTE — Assessment & Plan Note (Addendum)
 Currently medications are Farxiga , MTF, Rybelsus  and glimepiride .  No hypoglycemic episodes noted. Home blood sugars in the 150 range. Last visit medical regimen changes were none.  DM complicated by HTN and HL. Lab Results  Component Value Date   HGBA1C 7.1 (A) 10/06/2023  A1C today =  7.1.  continue current therapy.  Consider GLP-1 injections to aid weight loss.

## 2024-02-14 NOTE — Progress Notes (Signed)
 "   Date:  02/14/2024   Name:  Alicia Richard   DOB:  Oct 02, 1975   MRN:  [id number]   Chief Complaint: Diabetes  Diabetes She presents for her follow-up diabetic visit. She has type 2 diabetes mellitus. Her disease course has been stable. Pertinent negatives for hypoglycemia include no dizziness, headaches or nervousness/anxiousness. Pertinent negatives for diabetes include no chest pain, no fatigue and no weakness. Current diabetic treatments: Farxiga , MTF, glimepiride  and Rybelsus .  Hypertension This is a chronic problem. The problem is controlled. Pertinent negatives include no chest pain, headaches, palpitations or shortness of breath.    Review of Systems  Constitutional:  Negative for fatigue and unexpected weight change.  HENT:  Negative for trouble swallowing.   Eyes:  Negative for visual disturbance.  Respiratory:  Negative for cough, chest tightness, shortness of breath and wheezing.   Cardiovascular:  Negative for chest pain, palpitations and leg swelling.  Gastrointestinal:  Negative for abdominal pain, constipation and diarrhea.  Musculoskeletal:  Negative for arthralgias and myalgias.  Neurological:  Negative for dizziness, weakness, light-headedness and headaches.  Psychiatric/Behavioral:  Negative for dysphoric mood and sleep disturbance. The patient is not nervous/anxious.      Lab Results  Component Value Date   NA 138 07/22/2023   K 4.1 07/22/2023   CO2 25 07/22/2023   GLUCOSE 134 (H) 07/22/2023   BUN 12 07/22/2023   CREATININE 0.55 07/22/2023   CALCIUM  9.4 07/22/2023   EGFR 109 06/02/2023   GFRNONAA >60 07/22/2023   Lab Results  Component Value Date   CHOL 149 06/02/2023   HDL 39 (L) 06/02/2023   LDLCALC 79 06/02/2023   TRIG 179 (H) 06/02/2023   CHOLHDL 3.8 06/02/2023   Lab Results  Component Value Date   TSH 1.810 06/02/2023   Lab Results  Component Value Date   HGBA1C 7.1 (A) 02/14/2024   Lab Results  Component Value Date   WBC 9.4 07/22/2023    HGB 14.1 07/22/2023   HCT 40.7 07/22/2023   MCV 79.2 (L) 07/22/2023   PLT 314 07/22/2023   Lab Results  Component Value Date   ALT 46 (H) 07/22/2023   AST 29 07/22/2023   ALKPHOS 101 07/22/2023   BILITOT 0.7 07/22/2023   No results found for: MARIEN BOLLS, VD25OH   Patient Active Problem List   Diagnosis Date Noted   Colon cancer screening    Adenomatous polyp of ascending colon    Iron deficiency anemia 10/10/2020   Heart murmur 06/04/2020   Hyperlipidemia associated with type 2 diabetes mellitus (HCC) 10/11/2014   Essential (primary) hypertension 08/14/2014   Type II diabetes mellitus with complication (HCC) 08/14/2014    Allergies[1]  Past Surgical History:  Procedure Laterality Date   BREAST BIOPSY Right 10/10/2015   Stereotactic biopsy - benign   COLONOSCOPY WITH PROPOFOL  N/A 06/22/2021   Procedure: COLONOSCOPY WITH PROPOFOL ;  Surgeon: Unk Corinn Skiff, MD;  Location: [facility] END[facility]COPY;  Service: Gastroenterology;  Laterality: N/A;   WISDOM TOOTH EXTRACTION      Social History[2]   Medication list has been reviewed and updated.  Active Medications[3]     02/14/2024    4:21 PM 10/06/2023    9:32 AM 10/06/2023    9:27 AM 06/02/2023    9:24 AM  GAD 7 : Generalized Anxiety Score  Nervous, Anxious, on Edge 0 0 0 1  Control/stop worrying 0 0 0 1  Worry too much - different things 0 0 0 1  Trouble relaxing 0 0  0 0  Restless 0 0 0 0  Easily annoyed or irritable 0 0 0 0  Afraid - awful might happen 0 0 0 1  Total GAD 7 Score 0 0 0 4  Anxiety Difficulty Not difficult at all Not difficult at all Not difficult at all Somewhat difficult       02/14/2024    4:20 PM 10/06/2023    9:32 AM 10/06/2023    9:27 AM  Depression screen PHQ 2/9  Decreased Interest 0 0 0  Down, Depressed, Hopeless 0 0 0  PHQ - 2 Score 0 0 0  Altered sleeping 1 1 0  Tired, decreased energy 1 1 0  Change in appetite 0 0 0  Feeling bad or failure about yourself  0 0 0   Trouble concentrating 0 0 0  Moving slowly or fidgety/restless 0 0 0  Suicidal thoughts 0 0 0  PHQ-9 Score 2 2  0   Difficult doing work/chores Not difficult at all Not difficult at all Not difficult at all     Data saved with a previous flowsheet row definition    BP Readings from Last 3 Encounters:  02/14/24 126/76  10/06/23 124/78  07/22/23 135/81    Physical Exam Vitals and nursing note reviewed.  Constitutional:      General: She is not in acute distress.    Appearance: Normal appearance. She is well-developed.  HENT:     Head: Normocephalic and atraumatic.  Cardiovascular:     Rate and Rhythm: Normal rate and regular rhythm.  Pulmonary:     Effort: Pulmonary effort is normal. No respiratory distress.     Breath sounds: No wheezing or rhonchi.  Musculoskeletal:     Cervical back: Normal range of motion.     Right lower leg: No edema.     Left lower leg: No edema.  Lymphadenopathy:     Cervical: No cervical adenopathy.  Skin:    General: Skin is warm and dry.     Capillary Refill: Capillary refill takes less than 2 seconds.     Findings: No rash.  Neurological:     General: No focal deficit present.     Mental Status: She is alert and oriented to person, place, and time.  Psychiatric:        Mood and Affect: Mood normal.        Behavior: Behavior normal.     Wt Readings from Last 3 Encounters:  02/14/24 192 lb (87.1 kg)  10/06/23 193 lb (87.5 kg)  06/02/23 189 lb (85.7 kg)    BP 126/76   Pulse 86   Ht 5' 8 (1.727 m)   Wt 192 lb (87.1 kg)   SpO2 97%   BMI 29.19 kg/m   Assessment and Plan:  Problem List Items Addressed This Visit       Unprioritized   Essential (primary) hypertension (Chronic)   Well controlled blood pressure today. Current regimen is lisinopril . No medication side effects noted.  Renal function normal.        Type II diabetes mellitus with complication (HCC) - Primary (Chronic)   Currently medications are Farxiga , MTF,  Rybelsus  and glimepiride .  No hypoglycemic episodes noted. Home blood sugars in the 150 range. Last visit medical regimen changes were none.  DM complicated by HTN and HL. Lab Results  Component Value Date   HGBA1C 7.1 (A) 10/06/2023  A1C today =  7.1.  continue current therapy.  Consider GLP-1 injections to aid weight  loss.       Relevant Medications   Semaglutide  (RYBELSUS ) 14 MG TABS   Other Relevant Orders   POCT glycosylated hemoglobin (Hb A1C) (Completed)   Other Visit Diagnoses       Long term current use of oral hypoglycemic drug         Needs flu shot       Relevant Orders   Flu vaccine trivalent PF, 6mos and older(Flulaval,Afluria,Fluarix,Fluzone) (Completed)       No follow-ups on file.    Leita HILARIO Adie, MD [place] [facility] [place]           [1] No Known Allergies [2]  Social History Tobacco Use   Smoking status: Never   Smokeless tobacco: Never  Vaping Use   Vaping status: Never Used  Substance Use Topics   Alcohol use: No    Alcohol/week: 0.0 standard drinks of alcohol   Drug use: No  [3]  Current Meds  Medication Sig   atorvastatin  (LIPITOR) 10 MG tablet TAKE 1 TABLET(10 MG) BY MOUTH DAILY AT 6 PM   FARXIGA  10 MG TABS tablet TAKE 1 TABLET(10 MG) BY MOUTH DAILY BEFORE BREAKFAST   FREESTYLE LITE test strip U UTD ONCE D   glimepiride  (AMARYL ) 2 MG tablet TAKE 1 TABLET(2 MG) BY MOUTH DAILY BEFORE BREAKFAST   lisinopril  (ZESTRIL ) 20 MG tablet TAKE 1 TABLET(20 MG) BY MOUTH DAILY   metFORMIN  (GLUCOPHAGE -XR) 500 MG 24 hr tablet TAKE 2 TABLETS(1000 MG) BY MOUTH DAILY   nystatin-triamcinolone ointment (MYCOLOG) Apply topically 2 (two) times daily.   [DISCONTINUED] RYBELSUS  14 MG TABS TAKE 1 TABLET(14 MG) BY MOUTH DAILY   "

## 2024-03-14 ENCOUNTER — Other Ambulatory Visit: Payer: Self-pay

## 2024-03-14 DIAGNOSIS — I10 Essential (primary) hypertension: Secondary | ICD-10-CM

## 2024-03-14 DIAGNOSIS — E1169 Type 2 diabetes mellitus with other specified complication: Secondary | ICD-10-CM

## 2024-03-14 DIAGNOSIS — E118 Type 2 diabetes mellitus with unspecified complications: Secondary | ICD-10-CM

## 2024-03-14 MED ORDER — LISINOPRIL 20 MG PO TABS: 20.0000 mg | ORAL_TABLET | Freq: Every day | ORAL | 0 refills | Status: AC

## 2024-03-14 MED ORDER — ATORVASTATIN CALCIUM 10 MG PO TABS: 10.0000 mg | ORAL_TABLET | Freq: Every day | ORAL | 0 refills | Status: AC

## 2024-03-14 MED ORDER — METFORMIN HCL ER 500 MG PO TB24: 500.0000 mg | ORAL_TABLET | Freq: Two times a day (BID) | ORAL | 0 refills | Status: AC

## 2024-03-14 MED ORDER — DAPAGLIFLOZIN PROPANEDIOL 10 MG PO TABS: 10.0000 mg | ORAL_TABLET | Freq: Every day | ORAL | 0 refills | Status: AC

## 2024-03-14 MED ORDER — GLIMEPIRIDE 2 MG PO TABS: 2.0000 mg | ORAL_TABLET | Freq: Every day | ORAL | 0 refills | Status: AC

## 2024-06-07 ENCOUNTER — Encounter: Payer: PRIVATE HEALTH INSURANCE | Admitting: Student
